# Patient Record
Sex: Male | Born: 1935 | Race: Black or African American | Hispanic: No | Marital: Married | Smoking: Never smoker
Health system: Southern US, Community
[De-identification: ages and names within clinical notes are randomized; demographics above are authoritative.]

## PROBLEM LIST (undated history)

## (undated) DIAGNOSIS — I10 Essential (primary) hypertension: Secondary | ICD-10-CM

## (undated) DIAGNOSIS — C801 Malignant (primary) neoplasm, unspecified: Secondary | ICD-10-CM

## (undated) DIAGNOSIS — M1990 Unspecified osteoarthritis, unspecified site: Secondary | ICD-10-CM

## (undated) DIAGNOSIS — I491 Atrial premature depolarization: Secondary | ICD-10-CM

## (undated) DIAGNOSIS — N289 Disorder of kidney and ureter, unspecified: Secondary | ICD-10-CM

## (undated) DIAGNOSIS — R972 Elevated prostate specific antigen [PSA]: Secondary | ICD-10-CM

## (undated) DIAGNOSIS — D649 Anemia, unspecified: Secondary | ICD-10-CM

## (undated) HISTORY — PX: APPENDECTOMY: SHX54

## (undated) HISTORY — PX: HERNIA REPAIR: SHX51

## (undated) HISTORY — DX: Unspecified osteoarthritis, unspecified site: M19.90

## (undated) HISTORY — PX: JOINT REPLACEMENT: SHX530

---

## 2005-10-29 ENCOUNTER — Ambulatory Visit: Payer: Self-pay | Admitting: Orthopedic Surgery

## 2005-12-04 ENCOUNTER — Inpatient Hospital Stay (HOSPITAL_COMMUNITY): Admission: RE | Admit: 2005-12-04 | Discharge: 2005-12-10 | Payer: Self-pay | Admitting: Orthopedic Surgery

## 2005-12-04 ENCOUNTER — Ambulatory Visit: Payer: Self-pay | Admitting: Orthopedic Surgery

## 2005-12-24 ENCOUNTER — Ambulatory Visit: Payer: Self-pay | Admitting: Orthopedic Surgery

## 2006-01-16 ENCOUNTER — Ambulatory Visit: Payer: Self-pay | Admitting: Orthopedic Surgery

## 2006-03-06 ENCOUNTER — Ambulatory Visit: Payer: Self-pay | Admitting: Orthopedic Surgery

## 2006-06-05 ENCOUNTER — Ambulatory Visit: Payer: Self-pay | Admitting: Orthopedic Surgery

## 2011-05-14 ENCOUNTER — Emergency Department (HOSPITAL_COMMUNITY)
Admission: EM | Admit: 2011-05-14 | Discharge: 2011-05-14 | Disposition: A | Discharge status: Home / Self Care | Payer: Medicare Other | Attending: Emergency Medicine | Admitting: Emergency Medicine

## 2011-05-14 ENCOUNTER — Emergency Department (HOSPITAL_COMMUNITY): Payer: Medicare Other

## 2011-05-14 DIAGNOSIS — M25469 Effusion, unspecified knee: Secondary | ICD-10-CM | POA: Insufficient documentation

## 2011-05-14 DIAGNOSIS — X500XXA Overexertion from strenuous movement or load, initial encounter: Secondary | ICD-10-CM | POA: Insufficient documentation

## 2011-05-14 DIAGNOSIS — I10 Essential (primary) hypertension: Secondary | ICD-10-CM | POA: Insufficient documentation

## 2011-05-14 DIAGNOSIS — R29898 Other symptoms and signs involving the musculoskeletal system: Secondary | ICD-10-CM | POA: Insufficient documentation

## 2011-05-14 DIAGNOSIS — M25569 Pain in unspecified knee: Secondary | ICD-10-CM

## 2011-05-14 DIAGNOSIS — Z79899 Other long term (current) drug therapy: Secondary | ICD-10-CM | POA: Insufficient documentation

## 2011-05-14 HISTORY — DX: Anemia, unspecified: D64.9

## 2011-05-14 HISTORY — DX: Essential (primary) hypertension: I10

## 2011-05-14 MED ORDER — KETOROLAC TROMETHAMINE 60 MG/2ML IM SOLN
30.0000 mg | Freq: Once | INTRAMUSCULAR | Status: AC
  Administered 2011-05-14: 30 mg via INTRAMUSCULAR
  Filled 2011-05-14: qty 2

## 2011-05-14 MED ORDER — HYDROCODONE-ACETAMINOPHEN 5-325 MG PO TABS: ORAL_TABLET | ORAL | Status: AC

## 2011-05-14 NOTE — ED Notes (Signed)
Patient assisted to restroom via wheelchair and returned to room. Denies any other needs at this time.

## 2011-05-14 NOTE — ED Notes (Signed)
Pt reports was getting into his truck Christmas day and twisted his left knee.  Knee swollen.

## 2011-05-14 NOTE — ED Notes (Signed)
Patient with no complaints at this time. Respirations even and unlabored. Skin warm/dry. Discharge instructions reviewed with patient at this time. Patient given opportunity to voice concerns/ask questions. Patient discharged at this time and left Emergency Department with steady gait.   

## 2011-05-14 NOTE — ED Provider Notes (Signed)
History     CSN: [id number]  Arrival date & time 05/14/11  1158   First MD Initiated Contact with Patient 05/14/11 1326      Chief Complaint  Patient presents with  . Knee Pain    (Consider location/radiation/quality/duration/timing/severity/associated sxs/prior treatment) HPI Comments: Patient with hx of arthritis of bilateral knees comes to ed c/o pain to the left knee after twisting his knee greater than one week ago while getting into his truck.  Felt a sharp ain to his knee and has pain with weight bearing since that time.  He denies fall, swelling or the leg or calf, discoloration of the skin or any other sx's at this time.    Patient is a 77 y.o. male presenting with knee pain. The history is provided by the patient and a relative.  Knee Pain This is a new problem. The current episode started in the past 7 days. The problem occurs constantly. The problem has been unchanged. Associated symptoms include arthralgias and joint swelling. Pertinent negatives include no chest pain, myalgias, nausea, neck pain, numbness, vomiting or weakness. The symptoms are aggravated by walking, twisting and standing. He has tried nothing for the symptoms. The treatment provided no relief.    Past Medical History  Diagnosis Date  . Anemia   . Hypertension     Past Surgical History  Procedure Date  . Joint replacement   . Appendectomy   . Hernia repair     No family history on file.  History  Substance Use Topics  . Smoking status: Never Smoker   . Smokeless tobacco: Not on file  . Alcohol Use: No      Review of Systems  HENT: Negative for neck pain.   Respiratory: Negative for chest tightness and shortness of breath.   Cardiovascular: Negative for chest pain.  Gastrointestinal: Negative for nausea and vomiting.  Musculoskeletal: Positive for joint swelling and arthralgias. Negative for myalgias, back pain and gait problem.  Skin: Negative.   Neurological: Negative for  dizziness, weakness and numbness.  Psychiatric/Behavioral: Negative for confusion and decreased concentration.  All other systems reviewed and are negative.    Allergies  Review of patient's allergies indicates no known allergies.  Home Medications   Current Outpatient Rx  Name Route Sig Dispense Refill  . DOCUSATE SODIUM 100 MG PO CAPS Oral Take 100 mg by mouth 2 (two) times daily.      Marland Kitchen FERROUS SULFATE 325 (65 FE) MG PO TABS Oral Take 325 mg by mouth 2 (two) times daily.      Marland Kitchen LISINOPRIL-HYDROCHLOROTHIAZIDE 20-25 MG PO TABS Oral Take 1 tablet by mouth daily.        BP 134/78  Pulse 90  Temp(Src) 98.7 F (37.1 C) (Oral)  Resp 18  Ht 6\' 1"  (1.854 m)  Wt 190 lb (86.183 kg)  BMI 25.07 kg/m2  SpO2 98%  Physical Exam  Nursing note and vitals reviewed. Constitutional: He is oriented to person, place, and time. He appears well-developed and well-nourished. No distress.  HENT:  Head: Normocephalic and atraumatic.  Neck: Normal range of motion. Neck supple. No spinous process tenderness and no muscular tenderness present. Normal range of motion present.  Cardiovascular: Normal rate, regular rhythm and normal heart sounds.   No murmur heard. Pulmonary/Chest: Effort normal and breath sounds normal.  Musculoskeletal: He exhibits tenderness. He exhibits no edema.       Left knee: He exhibits bony tenderness. He exhibits normal range of motion, no swelling, no  effusion, no ecchymosis, no deformity, no laceration, no erythema, normal alignment and normal patellar mobility. tenderness found. Medial joint line and lateral joint line tenderness noted. No patellar tendon tenderness noted.       Legs:      Mild crepitus to the knee joint with movement.  No obvious ligament instability.  No calf pain or swelling , no hip tenderness with movement. Knee joint appears arthritic  Neurological: He is alert and oriented to person, place, and time. He exhibits normal muscle tone. Coordination normal.   Skin: Skin is warm and dry.    ED Course  Procedures (including critical care time)  Labs Reviewed - No data to display Dg Knee Complete 4 Views Left  05/14/2011  *RADIOLOGY REPORT*  Clinical Data: Left knee pain and swelling following a fall on 05/08/2011.  LEFT KNEE - COMPLETE 4+ VIEW  Comparison: None.  Findings: Severe tricompartmental degenerative changes with marked disc space narrowing and associated sclerosis and bony remodeling laterally.  No fracture, dislocation or effusion seen.  Faint rounded area of posterior calcification.  Atheromatous arterial calcifications.  IMPRESSION:  1.  No fracture. 2.  Severe tricompartmental degenerative changes. 3.  Possible popliteal cyst with calcified loose body formation.  Original Report Authenticated By: Darrol Angel, M.D.       MDM    Patient able to bear weight to the left knee.  Pain is reproduced with flexion, STS of the knee w/o obvious effusion or ligament instability.  No neck or back pain on exam.  DP pulse is brisk, sensation intact.  CR <2 sec.  Pt is followed by Dr. Romeo Apple.  Agrees to f/u with him on Wednesday in his office    Pt feels improved after observation and/or treatment in ED.   Patient / Family / Caregiver understand and agree with initial ED impression and plan with expectations set for ED visit.   Crystie Yanko L. [place], [place] 05/16/11 2243

## 2011-05-14 NOTE — ED Provider Notes (Signed)
Medical screening examination/treatment/procedure(s) were performed by non-physician practitioner and as supervising physician I was immediately available for consultation/collaboration.  Blakelyn Dinges P Byrant Valent, MD 05/14/11 1517 

## 2011-05-18 NOTE — ED Provider Notes (Signed)
Medical screening examination/treatment/procedure(s) were performed by non-physician practitioner and as supervising physician I was immediately available for consultation/collaboration.  Velmer Broadfoot P Solomia Harrell, MD 05/18/11 1105 

## 2011-06-05 ENCOUNTER — Encounter: Payer: Self-pay | Admitting: Orthopedic Surgery

## 2011-06-05 ENCOUNTER — Ambulatory Visit (INDEPENDENT_AMBULATORY_CARE_PROVIDER_SITE_OTHER): Payer: Medicare Other | Admitting: Orthopedic Surgery

## 2011-06-05 VITALS — BP 104/64 | Ht 73.0 in | Wt 190.0 lb

## 2011-06-05 DIAGNOSIS — M1710 Unilateral primary osteoarthritis, unspecified knee: Secondary | ICD-10-CM

## 2011-06-05 DIAGNOSIS — IMO0002 Reserved for concepts with insufficient information to code with codable children: Secondary | ICD-10-CM

## 2011-06-05 NOTE — Patient Instructions (Signed)
Consult with Dr Jerelyn Scott to start Procrit for anemia prior to TKA

## 2011-06-06 ENCOUNTER — Encounter: Payer: Self-pay | Admitting: Orthopedic Surgery

## 2011-06-06 DIAGNOSIS — M1710 Unilateral primary osteoarthritis, unspecified knee: Secondary | ICD-10-CM | POA: Insufficient documentation

## 2011-06-06 NOTE — Progress Notes (Addendum)
Patient ID: Devon Griffin, male   DOB: November 11, 1928, 78 y.o.   MRN: [id number]   LEFT knee pain as the chief complaint  This patient had a RIGHT knee replacement in 2008 and did well  He now complains of 6 years of pain in the LEFT knee gradually worsening requiring an emergency room visit on December 31.  His pain is described as sharp intermittent and of moderate intensity, 6/10.  The pain is worse after moving her walking better when sitting worsening cold weather.  He has a history of anemia.  He is interested in getting a LEFT knee replacement  The joint will swell on him and it will catch and lock at times it is stiff.  All the review of systems are normal except for easy bruising and bleeding.  Past Medical History  Diagnosis Date  . Anemia   . Hypertension   . Arthritis    Past Surgical History  Procedure Date  . Joint replacement   . Appendectomy   . Hernia repair    History   Social History  . Marital Status: Married    Spouse Name: N/A    Number of Children: N/A  . Years of Education: N/A   Occupational History  . Not on file.   Social History Main Topics  . Smoking status: Never Smoker   . Smokeless tobacco: Not on file  . Alcohol Use: No  . Drug Use: No  . Sexually Active:    Other Topics Concern  . Not on file   Social History Narrative  . No narrative on file     BP 104/64  Ht 6\' 1"  (1.854 m)  Wt 190 lb (86.183 kg)  BMI 25.07 kg/m2 Somewhat tall lean and in good shape for his age.  Peripheral pulses are normal no swelling or edema.  No evidence of lymphadenopathy.  Skin normal.  Neuro signs are normal.  He's awake alert and oriented x3.  He is ambulatory no assistive devices are needed to his LEFT knee is in valgus and he favors it significantly. On inspecion we have a valgus knee wih join line enderness laerally.  Range of moion is approximaely 118 with a flexion contracture of 5.  Strength is normal.  The knee is stable including the  MCL.  X-rays show severe valgus osteoarthritis of the knee that film is dated December 25 it was done at the hospital.  Note he also had significant LEFT knee effusion today.  Knee  Injection and aspiration Procedure Note  Pre-operative Diagnosis: left knee oa, effusion  Post-operative Diagnosis: same  Indications: pain, swelling  Anesthesia: ethyl chloride   Procedure Details   Verbal consent was obtained for the procedure. Time out was completed.The joint was prepped with alcohol, followed by  Ethyl chloride spray and The 18-gauge needle was inserted into the joint via lateral approach and we aspirated approximately 100 cc of clear yellow fluid  This was followed by the injection of 4ml 1% lidocaine and 1 ml of depomedrol  was then injected into the joint . The needle was removed and the area cleansed and dressed.  Complications:  None; patient tolerated the procedure well.  To prepare for the knee replacement surgery on the LEFT knee we will need to get him on Procrit he has a hemoglobin last recorded as 9.  I would like to see that it well to prevent or decrease the chance of transfusion.  He will also need a consultation with medical  doctor preferably not a nurse practitioner cleared him for surgery based on his age although he appears healthy.  Plan is for LEFT total knee arthroplasty.  The risks and benefits of the surgery including the complications and the treatment have been reviewed with the patient and he understands from his previous surgery what these are.

## 2011-06-15 DEATH — deceased

## 2011-06-19 ENCOUNTER — Encounter (HOSPITAL_COMMUNITY): Payer: Medicare Other | Attending: Oncology | Admitting: Oncology

## 2011-06-19 ENCOUNTER — Encounter (HOSPITAL_COMMUNITY): Payer: Medicare Other

## 2011-06-19 ENCOUNTER — Telehealth: Payer: Self-pay | Admitting: Orthopedic Surgery

## 2011-06-19 ENCOUNTER — Encounter (HOSPITAL_COMMUNITY): Payer: Self-pay | Admitting: Oncology

## 2011-06-19 VITALS — BP 119/82 | HR 82 | Temp 98.0°F | Ht 73.0 in | Wt 190.1 lb

## 2011-06-19 DIAGNOSIS — D649 Anemia, unspecified: Secondary | ICD-10-CM

## 2011-06-19 DIAGNOSIS — M1990 Unspecified osteoarthritis, unspecified site: Secondary | ICD-10-CM | POA: Insufficient documentation

## 2011-06-19 DIAGNOSIS — D638 Anemia in other chronic diseases classified elsewhere: Secondary | ICD-10-CM | POA: Insufficient documentation

## 2011-06-19 DIAGNOSIS — I10 Essential (primary) hypertension: Secondary | ICD-10-CM | POA: Insufficient documentation

## 2011-06-19 HISTORY — DX: Anemia, unspecified: D64.9

## 2011-06-19 LAB — COMPREHENSIVE METABOLIC PANEL
ALT: 9 U/L (ref 0–53)
Albumin: 3.6 g/dL (ref 3.5–5.2)
BUN: 27 mg/dL — ABNORMAL HIGH (ref 6–23)
GFR calc Af Amer: 88 mL/min — ABNORMAL LOW (ref >90)
Potassium: 4.4 mEq/L (ref 3.5–5.1)
Total Bilirubin: 0.2 mg/dL — ABNORMAL LOW (ref 0.3–1.2)
Total Protein: 6.9 g/dL (ref 6.0–8.3)

## 2011-06-19 LAB — CBC
MCH: 30.7 pg (ref 26.0–34.0)
MCHC: 31.8 g/dL (ref 30.0–36.0)
MCV: 96.5 fL (ref 78.0–100.0)
RBC: 3.45 MIL/uL — ABNORMAL LOW (ref 4.22–5.81)
RDW: 14.1 % (ref 11.5–15.5)
WBC: 4.2 10*3/uL (ref 4.0–10.5)

## 2011-06-19 LAB — DIFFERENTIAL
Eosinophils Absolute: 0.1 10*3/uL (ref 0.0–0.7)
Eosinophils Relative: 3 % (ref 0–5)
Lymphocytes Relative: 27 % (ref 12–46)
Lymphs Abs: 1.1 10*3/uL (ref 0.7–4.0)
Monocytes Relative: 9 % (ref 3–12)
Neutro Abs: 2.6 10*3/uL (ref 1.7–7.7)

## 2011-06-19 LAB — SEDIMENTATION RATE: Sed Rate: 9 mm/hr (ref 0–16)

## 2011-06-19 LAB — FOLATE: Folate: 13.3 ng/mL

## 2011-06-19 LAB — RETICULOCYTES: Retic Ct Pct: 0.9 % (ref 0.4–3.1)

## 2011-06-19 MED ORDER — EPOETIN ALFA 40000 UNIT/ML IJ SOLN
INTRAMUSCULAR | Status: AC
  Administered 2011-06-19: 40000 [IU] via SUBCUTANEOUS
  Filled 2011-06-19: qty 1

## 2011-06-19 MED ORDER — EPOETIN ALFA 40000 UNIT/ML IJ SOLN
40000.0000 [IU] | Freq: Once | INTRAMUSCULAR | Status: AC
  Administered 2011-06-19: 40000 [IU] via SUBCUTANEOUS

## 2011-06-19 NOTE — Patient Instructions (Signed)
MATHESON VANDEHEI  [id number] 1928-08-25   [facility]  Discharge Instructions  RECOMMENDATIONS MADE BY THE CONSULTANT AND ANY TEST RESULTS WILL BE SENT TO YOUR REFERRING DOCTOR.   EXAM FINDINGS BY MD TODAY AND SIGNS AND SYMPTOMS TO REPORT TO CLINIC OR PRIMARY MD: Need to draw some blood work today and we will start you on procrit injections 40,000 units weekly.  Want to get your hemoglobin to 12 grams before surger.  MEDICATIONS PRESCRIBED: none  SPECIAL INSTRUCTIONS/FOLLOW-UP: Lab work Needed weekly with injections and Return to Clinic in 1 month to see PA.   I acknowledge that I have been informed and understand all the instructions given to me and received a copy. I do not have any more questions at this time, but understand that I may call the Specialty Clinic at [facility] at [phone] during business hours should I have any further questions or need assistance in obtaining follow-up care.    __________________________________________  _____________  __________ Signature of Patient or Authorized Representative            Date                   Time    __________________________________________ Nurse's Signature

## 2011-06-19 NOTE — Telephone Encounter (Signed)
Dr. Mariel Sleet called to speak with Dr. Romeo Apple regarding mutual patient.  Relayed Dr. Romeo Apple is in surgery this afternoon. States okay to return his call tomorrow, Wednesday, 06/20/11, after 9:00AM.  He also states he will be away at meeting Thursday and Friday.  Phone # is [phone].

## 2011-06-19 NOTE — Progress Notes (Signed)
[facility] NEW PATIENT EVALUATION   Name: Devon Griffin Date: 06/19/2011 MRN: [id number] DOB: 02-06-1929    CC: Lorin Picket [facility]   DIAGNOSIS: The encounter diagnosis was Anemia.   HISTORY OF PRESENT ILLNESS:Devon Griffin is a 78 y.o. African American male who has a past medical history of HTN, and osteoarthritis who was referred to the [facility] for further evaluation and treatment of his anemia pre-operatively for a left knee replacement.  The patient denies any complaints.  He denies any headaches, dizziness, double vision, fevers, chills, night sweats, nausea, vomiting, diarrhea, chest pain, heart palpitations, abdominal pain, constipation, blood in stool, black tarry stool, urinary complaints, and hematuria.  He reports that his appetite and energy level has not changed or decreased.  The patient reports that he has never had a colonoscopy, but recently underwent a prostate digital exam by his PCP.  Review of his lab work reveals on January 01, 2011, the patient's hemoglobin was 9.8, with a normal MCV, WBC 4.4, and platelet count 213, low TIBC at 212, with a low normal serum iron, normal ferritin at 226, B12 397, and folate 16.2.  It appears that the patient has an anemia of chronic disease.  Interestingly, the patient began Ferrous sulfate 325 mg BID daily about 6 months ago.  Since then, his stool color has darkened, but his stools were not dark prior to initiation of iron medication.  He is tolerating the oral iron well without constipation.   FAMILY HISTORY: family history includes Arthritis in an unspecified family member; Cancer in his father; and Diabetes in his mother and unspecified family member.  The patient's father passed away at the age of 35 from rectal cancer.  His mother passed away at the age of 14 from complications of diabetes.  He has 5 deceased brothers and one deceased sister.  None of whom passed away from cancer. He  has no children.  PAST MEDICAL HISTORY:  has a past medical history of Anemia; Hypertension; Arthritis; and Anemia (06/19/2011).       CURRENT MEDICATIONS: See list   SOCIAL HISTORY:  reports that he has never smoked. He has never used smokeless tobacco. He reports that he does not drink alcohol or use illicit drugs.  The patient reports that he was born and presently resides in [place].  He completed 8th grade of schooling.  He worked as a Copy.  He has been married for 50 years to his wife.  He denies any EtOH, tobacco abuse, and illicit drug use.   ALLERGIES: Review of patient's allergies indicates no known allergies.   LABORATORY DATA:  Review of his lab work reveals on January 01, 2011, the patient's hemoglobin was 9.8, with a normal MCV, WBC 4.4, and platelet count 213, low TIBC at 212, with a low normal serum iron, normal ferritin at 226, B12 397, and folate 16.2.  It appears that the patient has an anemia of chronic disease.     REVIEW OF SYSTEMS: Patient reports no health concerns.   PHYSICAL EXAM:  height is 6\' 1"  (1.854 m) and weight is 190 lb 1.6 oz (86.229 kg). His oral temperature is 98 F (36.7 C). His blood pressure is 119/82 and his pulse is 82.  General appearance: alert, cooperative, appears stated age and no distress Head: Normocephalic, without obvious abnormality, atraumatic Ear: Left otoscopic exam reveals a small amount of cerumen in the canal in the anterior/inferior position.  TM is pearly grey without any erythema, discharge, or effusion.  Right side reveals cerumen impaction and I am unable to visualize the TM and the medial canal. Neck: no adenopathy, no carotid bruit, supple, symmetrical, trachea midline and thyroid not enlarged, symmetric, no tenderness/mass/nodules Lymph nodes: Cervical, supraclavicular, and axillary nodes normal. Resp: clear to auscultation bilaterally and normal percussion bilaterally Cardio: regular rate and  rhythm, S1, S2 normal, S3 present, S4 present and systolic ejection murmur heard best at left sternal border at 5/6th intercostal space. GI: soft, non-tender; bowel sounds normal; no masses,  no organomegaly Extremities: edema 1+ LE pitting edema B/L Neurologic: Grossly normal     IMPRESSION:  1. Anemia, with an element of anemia of chronic disease.  He is on PO iron 325 mg BID and tolerating well. 2. HTN 3. Osteoarthritis 4. Right sided ear cerumen.   PLAN:  1. Lab work today: CBC diff, CMET, Retic count, ESR, Epo level, and Anemia Panel. 2. Initiate procrit 40,000 units today 3. Procrit 40,000 units weekly x 12 weeks with the target/goal of 12 g/dL. 4. Recommend screening colonoscopy at some time if PCP thinks this is reasonable at this gentleman's age.  Patient has never had a colonoscopy. 5. Recommend the patient follow-up with PCP regarding his right sided ear cerumen for decongestion/removal. 6. Return for weekly Procrit injections and CBC every other week. 7. Return in one month for follow-up.  All questions were answered.  The patient knows to call the clinic with any questions or concerns.    Patient and plan discussed with Dr. Mariel Sleet and he is in agreement with the aforementioned.  Patient seen by Dr. Mariel Sleet as well.  KEFALAS,THOMAS

## 2011-06-20 LAB — ERYTHROPOIETIN: Erythropoietin: 16.8 m[IU]/mL (ref 2.6–34.0)

## 2011-06-20 LAB — IRON AND TIBC
Iron: 69 ug/dL (ref 42–135)
Saturation Ratios: 29 % (ref 20–55)
TIBC: 235 ug/dL (ref 215–435)
UIBC: 166 ug/dL (ref 125–400)

## 2011-06-26 ENCOUNTER — Encounter (HOSPITAL_BASED_OUTPATIENT_CLINIC_OR_DEPARTMENT_OTHER): Payer: Medicare Other

## 2011-06-26 VITALS — BP 150/68 | HR 73

## 2011-06-26 DIAGNOSIS — D649 Anemia, unspecified: Secondary | ICD-10-CM

## 2011-06-26 MED ORDER — EPOETIN ALFA 40000 UNIT/ML IJ SOLN
INTRAMUSCULAR | Status: AC
  Administered 2011-06-26: 40000 [IU] via SUBCUTANEOUS
  Filled 2011-06-26: qty 1

## 2011-06-26 MED ORDER — EPOETIN ALFA 40000 UNIT/ML IJ SOLN
40000.0000 [IU] | Freq: Once | INTRAMUSCULAR | Status: AC
  Administered 2011-06-26: 40000 [IU] via SUBCUTANEOUS

## 2011-06-26 NOTE — Progress Notes (Signed)
Devon Griffin presents today for injection per MD orders. Procrit 78295 units administered SQ in right Abdomen. Administration without incident. Patient tolerated well.

## 2011-07-03 ENCOUNTER — Encounter (HOSPITAL_BASED_OUTPATIENT_CLINIC_OR_DEPARTMENT_OTHER): Payer: Medicare Other

## 2011-07-03 DIAGNOSIS — D649 Anemia, unspecified: Secondary | ICD-10-CM

## 2011-07-03 LAB — CBC
MCH: 30.8 pg (ref 26.0–34.0)
Platelets: 191 10*3/uL (ref 150–400)

## 2011-07-03 NOTE — Progress Notes (Signed)
Hgb is 13.5 .  Procrit held .  Directed to return in one week.

## 2011-07-10 ENCOUNTER — Encounter (HOSPITAL_BASED_OUTPATIENT_CLINIC_OR_DEPARTMENT_OTHER): Payer: Medicare Other

## 2011-07-10 DIAGNOSIS — D649 Anemia, unspecified: Secondary | ICD-10-CM

## 2011-07-10 LAB — CBC
Hemoglobin: 12.3 g/dL — ABNORMAL LOW (ref 13.0–17.0)
MCHC: 32.1 g/dL (ref 30.0–36.0)
MCV: 97.7 fL (ref 78.0–100.0)
Platelets: 162 10*3/uL (ref 150–400)
RBC: 3.92 MIL/uL — ABNORMAL LOW (ref 4.22–5.81)
RDW: 15.1 % (ref 11.5–15.5)

## 2011-07-10 MED ORDER — EPOETIN ALFA 40000 UNIT/ML IJ SOLN
INTRAMUSCULAR | Status: AC
  Administered 2011-07-10: 40000 [IU] via SUBCUTANEOUS
  Filled 2011-07-10: qty 1

## 2011-07-10 MED ORDER — EPOETIN ALFA 40000 UNIT/ML IJ SOLN
40000.0000 [IU] | Freq: Once | INTRAMUSCULAR | Status: AC
  Administered 2011-07-10: 40000 [IU] via SUBCUTANEOUS

## 2011-07-10 NOTE — Progress Notes (Signed)
Tolerated injection well. 

## 2011-07-17 ENCOUNTER — Encounter (HOSPITAL_COMMUNITY): Payer: Medicare Other | Attending: Oncology

## 2011-07-17 ENCOUNTER — Ambulatory Visit (INDEPENDENT_AMBULATORY_CARE_PROVIDER_SITE_OTHER): Payer: Medicare Other | Admitting: Orthopedic Surgery

## 2011-07-17 ENCOUNTER — Ambulatory Visit (HOSPITAL_COMMUNITY): Payer: Medicare Other | Admitting: Oncology

## 2011-07-17 ENCOUNTER — Encounter (HOSPITAL_BASED_OUTPATIENT_CLINIC_OR_DEPARTMENT_OTHER): Payer: Medicare Other | Admitting: Oncology

## 2011-07-17 ENCOUNTER — Encounter (HOSPITAL_COMMUNITY): Payer: Self-pay | Admitting: Oncology

## 2011-07-17 ENCOUNTER — Encounter: Payer: Self-pay | Admitting: Orthopedic Surgery

## 2011-07-17 VITALS — BP 126/60 | Ht 73.0 in | Wt 190.0 lb

## 2011-07-17 VITALS — BP 146/64 | HR 80 | Temp 97.8°F | Wt 191.1 lb

## 2011-07-17 DIAGNOSIS — M1990 Unspecified osteoarthritis, unspecified site: Secondary | ICD-10-CM

## 2011-07-17 DIAGNOSIS — M1710 Unilateral primary osteoarthritis, unspecified knee: Secondary | ICD-10-CM

## 2011-07-17 DIAGNOSIS — D649 Anemia, unspecified: Secondary | ICD-10-CM | POA: Insufficient documentation

## 2011-07-17 DIAGNOSIS — I10 Essential (primary) hypertension: Secondary | ICD-10-CM

## 2011-07-17 LAB — CBC
HCT: 39.9 % (ref 39.0–52.0)
Hemoglobin: 12.1 g/dL — ABNORMAL LOW (ref 13.0–17.0)
MCH: 30 pg (ref 26.0–34.0)
MCV: 99 fL (ref 78.0–100.0)
RBC: 4.03 MIL/uL — ABNORMAL LOW (ref 4.22–5.81)

## 2011-07-17 MED ORDER — EPOETIN ALFA 40000 UNIT/ML IJ SOLN
40000.0000 [IU] | Freq: Once | INTRAMUSCULAR | Status: AC
  Administered 2011-07-17: 40000 [IU] via SUBCUTANEOUS

## 2011-07-17 MED ORDER — EPOETIN ALFA 40000 UNIT/ML IJ SOLN
INTRAMUSCULAR | Status: AC
  Administered 2011-07-17: 40000 [IU] via SUBCUTANEOUS
  Filled 2011-07-17: qty 1

## 2011-07-17 NOTE — Patient Instructions (Signed)
[facility]  Discharge Instructions  RECOMMENDATIONS MADE BY THE CONSULTANT AND ANY TEST RESULTS WILL BE SENT TO YOUR REFERRING DOCTOR.   EXAM FINDINGS BY MD TODAY AND SIGNS AND SYMPTOMS TO REPORT TO CLINIC OR PRIMARY MD: We will try to keep you at a hgb of 12-13. We will check labs before each shot.   INSTRUCTIONS GIVEN AND DISCUSSED: See surgeon as scheduled. SPECIAL INSTRUCTIONS/FOLLOW-UP: Appt with Tom in 6 weeks.  I acknowledge that I have been informed and understand all the instructions given to me and received a copy. I do not have any more questions at this time, but understand that I may call the Specialty Clinic at [facility] at [phone] during business hours should I have any further questions or need assistance in obtaining follow-up care.    __________________________________________  _____________  __________ Signature of Patient or Authorized Representative            Date                   Time    __________________________________________ Nurse's Signature

## 2011-07-17 NOTE — Patient Instructions (Signed)
Surgery scheduled for 08/20/11

## 2011-07-17 NOTE — Progress Notes (Signed)
Devon Griffin presented for labwork. Labs per MD order drawn via Peripheral Line 23 gauge needle inserted in right forearm   Good blood return present. Procedure without incident.  Needle removed intact. Patient tolerated procedure well.  Devon Griffin presents today for injection per MD orders. Procrit 40,000units administered SQ in right Abdomen. Administration without incident. Patient tolerated well.

## 2011-07-17 NOTE — Progress Notes (Signed)
No primary provider on file. No primary provider on file.  1. Anemia  CBC, epoetin alfa (EPOGEN,PROCRIT) injection 40,000 Units    CURRENT THERAPY: Procrit 40,000 units weekly  INTERVAL HISTORY: Devon Griffin 78 y.o. male returns for  regular  visit for followup of anemia and its treatment in preparation for a left TKA by Dr. Romeo Apple (Orthopod).    The patient denies any complaints  I personally reviewed and went over laboratory results with the patient.  His hemoglobin is responding to CSF, namely Procrit 40,000 units weekly.  His Hgb on 06/19/2011 was 10.6.  His Hgb last week (07/10/11) was 12.3.  Dr. Mariel Sleet contacted Dr. Romeo Apple to let him know that his Hgb is responding and he is good for surgical intervention when deemed fit.  We will continue Procrit to maintain a Hgb of 12-13 g/dL.  We will maintain this level for 4-6 weeks after surgery as well.   ROS: No TIA's or unusual headaches, no dysphagia.  No prolonged cough. No dyspnea or chest pain on exertion.  No abdominal pain, change in bowel habits, black or bloody stools.  No urinary tract symptoms.  No new or unusual musculoskeletal symptoms.    Past Medical History  Diagnosis Date  . Anemia   . Hypertension   . Arthritis   . Anemia 06/19/2011    has OA (osteoarthritis) of knee and Anemia on his problem list.      has no known allergies.  We administered epoetin alfa and epoetin alfa.  Past Surgical History  Procedure Date  . Joint replacement     right knee  . Appendectomy   . Hernia repair     bilateral inguinal repair    Denies any headaches, dizziness, double vision, fevers, chills, night sweats, nausea, vomiting, diarrhea, constipation, chest pain, heart palpitations, shortness of breath, blood in stool, black tarry stool, urinary pain, urinary burning, urinary frequency, hematuria.   PHYSICAL EXAMINATION  ECOG PERFORMANCE STATUS: 2 - Symptomatic, <50% confined to bed  Filed Vitals:   07/17/11 0900  BP:  146/64  Pulse: 80  Temp: 97.8 F (36.6 C)    GENERAL:alert, no distress, well nourished, well developed, comfortable, cooperative, smiling and in a wheelchair. SKIN: skin color, texture, turgor are normal, no rashes or significant lesions HEAD: Normocephalic, No masses, lesions, tenderness or abnormalities EYES: normal EARS: External ears normal OROPHARYNX:mucous membranes are moist  NECK: supple, trachea midline LYMPH:  no palpable lymphadenopathy BREAST:not examined LUNGS: clear to auscultation  HEART: regular rate & rhythm, no gallops, S1 normal, S2 normal and a 1-2/6 systolic ejection murmur appreciated ABDOMEN:abdomen soft, non-tender and normal bowel sounds BACK: Back symmetric, no curvature., No CVA tenderness EXTREMITIES:less then 2 second capillary refill, no skin discoloration, no clubbing, no cyanosis  NEURO: alert & oriented x 3 with fluent speech, no focal motor/sensory deficit   LABORATORY DATA:  Results for Devon, Griffin (MRN [id number]) as of 07/17/2011 10:06  Ref. Range 06/19/2011 12:05 07/03/2011 13:45 07/10/2011 13:55  HGB Latest Range: 13.0-17.0 g/dL 78.2 (L) 95.6 21.3 (L)    PENDING LABS: CBC    ASSESSMENT:  1. Anemia, with an element of anemia of chronic disease. He is on PO iron 325 mg BID and tolerating well.  2. HTN  3. Osteoarthritis  4. Right sided ear cerumen.   PLAN:  1. Lab work today and weekly with Procrit injection: CBC 2. Procrit injection today, 40,000.  Weekly Procrit injection. 3. Hemoglobin goal 12-13 g/dL 4. He has an  appointment today to see Dr. Romeo Apple. 5. I personally reviewed and went over laboratory results with the patient. 6. Will maintain a hemoglobin of 12-13 g/dL 4-6 weeks following surgery. 7. Return in 6 weeks for follow-up.   All questions were answered. The patient knows to call the clinic with any problems, questions or concerns. We can certainly see the patient much sooner if necessary.  The patient and plan  discussed with Glenford Peers, MD and he is in agreement with the aforementioned.   Devon Griffin

## 2011-07-17 NOTE — Progress Notes (Signed)
Patient ID: Devon Griffin, male   DOB: May 16, 1928, 78 y.o.   MRN: [id number] Chief Complaint  Patient presents with  . Follow-up    6 week recheck on left knee.   The patient is seen today for preoperative evaluation for left knee arthroplasties recently been on Procrit to increase his hemoglobin and his hemoglobin has gone up to 13. I spoke to his haematologist we'll continue this up to the time of surgery.  See initial eval note   The history and physical for surgery to be done at a later time and is incorporated by reference  Risks and benefits of the procedure have been explained to the patient and his niece and they aren't comfortable with the potential complications. The patient was satisfied with his right total knee and has come back to have his left knee replaced as well.

## 2011-08-02 ENCOUNTER — Telehealth: Payer: Self-pay | Admitting: Orthopedic Surgery

## 2011-08-02 NOTE — Telephone Encounter (Signed)
RE:  In-patient surgery scheduled 08/20/11 at [facility], CPT [phone], total knee arthroscopy, ICD9 codes 715.96, 715.16 - Per Medicare guidelines, no pre-authorization is required.

## 2011-08-06 ENCOUNTER — Encounter (HOSPITAL_COMMUNITY): Payer: Self-pay | Admitting: Pharmacy Technician

## 2011-08-13 NOTE — Patient Instructions (Addendum)
20 Devon Griffin  08/13/2011   Your procedure is scheduled on:  08/20/2011  Report to [facility] at  615  AM.  Call this number if you have problems the morning of surgery: [phone]   Remember:   Do not eat food:After Midnight.  May have clear liquids:until Midnight .  Clear liquids include soda, tea, black coffee, apple or grape juice, broth.  Take these medicines the morning of surgery with A SIP OF WATER: norco,lisinopril   Do not wear jewelry, make-up or nail polish.  Do not wear lotions, powders, or perfumes. You may wear deodorant.  Do not shave 48 hours prior to surgery.  Do not bring valuables to the hospital.  Contacts, dentures or bridgework may not be worn into surgery.  Leave suitcase in the car. After surgery it may be brought to your room.  For patients admitted to the hospital, checkout time is 11:00 AM the day of discharge.   Patients discharged the day of surgery will not be allowed to drive home.  Name and phone number of your driver: family  Special Instructions: CHG Shower Use Special Wash: 1/2 bottle night before surgery and 1/2 bottle morning of surgery.   Please read over the following fact sheets that you were given: Pain Booklet, Total Joint Packet, MRSA Information, Surgical Site Infection Prevention, Anesthesia Post-op Instructions and Care and Recovery After Surgery Total Knee Replacement In total knee replacement, the damaged knee is replaced with an artificial knee joint (prosthesis). The purpose of this surgery is to reduce pain and improve your range of motion. Regaining a near normal range of motion of your knee in the first 3 to 6 weeks after surgery is critical. Generally, these artificial joints last a minimum of 10 years. By that time, about 1 in 10 patients will need another surgery to repair the loose prosthesis. LET YOUR CAREGIVER KNOW ABOUT:   Allergies to food or medicine.   Medicines taken, including vitamins, herbs, eyedrops, over-the-counter  medicines, and creams.   Use of steroids (by mouth or creams).   Previous problems with anesthetics or numbing medicines.   History of bleeding problems or blood clots.   Previous surgery.   Other health problems, including diabetes and kidney problems.   Possibility of pregnancy, if this applies.  RISKS AND COMPLICATIONS   Knee pain.   Loss of range of motion of the knee (stiffness) or instability.   Loosening of the prosthesis.   Infection.  BEFORE THE PROCEDURE   If you smoke, quit.   You may need a replacement or addition of blood (transfusion) during this procedure. You may want to donate your own blood for storage several weeks before the procedure. This way, your own blood can be stored and given to you if needed. Talk to your surgeon about this option.   Do not eat or drink anything for as long as directed by your caregiver before the procedure.   You may bathe and brush your teeth before the procedure. Do not swallow the water when brushing your teeth.   Scrub the area to be operated on for 5 minutes in the morning before the procedure. Use special soap if you are directed to do so by your caregiver.   Take your regular medicines with a small sip of water unless otherwise instructed. Your caregiver will let you know if there are medicines that need to be stopped and for how long.   You should be present 60 minutes before your  procedure or as directed by your caregiver.  PROCEDURE  Before surgery, an intravenous (IV) access for giving fluids will be started. You will be given medicines and/or gas to make you sleep (anesthetic). You may be given medicines in your back with a needle to make you numb from the waist down. Your surgeon will take out any damaged cartilage and bone. He or she will then put in new metal, plastic, or ceramic joint surfaces to restore alignment and function to your knee. AFTER THE PROCEDURE  You will be taken to the recovery area where a nurse  will watch and check your progress. You may have a long, narrow tube (catheter) in your bladder after surgery. The catheter helps you empty your bladder (pass your urine). You may have drainage tubes coming out from under the dressing. These tubes attach to a device that removes blood or fluids that gather after surgery. Once you are awake, stable, and taking fluids well, you will be returned to your room. You will receive physical therapy as prescribed by your caregiver. If you do not have help at home, you may need to go to an extended care facility for a few weeks. If you are sent home with a continuous passive motion (CPM) machine, use it as instructed. Document Released: 08/06/2000 Document Revised: 04/19/2011 Document Reviewed: 03/02/2009 [facility] Patient Information 2012 [place], [place].PATIENT INSTRUCTIONS POST-ANESTHESIA  IMMEDIATELY FOLLOWING SURGERY:  Do not drive or operate machinery for the first twenty four hours after surgery.  Do not make any important decisions for twenty four hours after surgery or while taking narcotic pain medications or sedatives.  If you develop intractable nausea and vomiting or a severe headache please notify your doctor immediately.  FOLLOW-UP:  Please make an appointment with your surgeon as instructed. You do not need to follow up with anesthesia unless specifically instructed to do so.  WOUND CARE INSTRUCTIONS (if applicable):  Keep a dry clean dressing on the anesthesia/puncture wound site if there is drainage.  Once the wound has quit draining you may leave it open to air.  Generally you should leave the bandage intact for twenty four hours unless there is drainage.  If the epidural site drains for more than 36-48 hours please call the anesthesia department.  QUESTIONS?:  Please feel free to call your physician or the hospital operator if you have any questions, and they will be happy to assist you.     [facility] Anesthesia Department [address] [place] [phone]

## 2011-08-14 ENCOUNTER — Other Ambulatory Visit: Payer: Self-pay

## 2011-08-14 ENCOUNTER — Encounter (HOSPITAL_COMMUNITY): Payer: Self-pay

## 2011-08-14 ENCOUNTER — Encounter (HOSPITAL_COMMUNITY)
Admission: RE | Admit: 2011-08-14 | Discharge: 2011-08-14 | Disposition: A | Discharge status: Home / Self Care | Payer: Medicare Other | Source: Ambulatory Visit | Attending: Orthopedic Surgery | Admitting: Orthopedic Surgery

## 2011-08-14 LAB — BASIC METABOLIC PANEL
CO2: 28 mEq/L (ref 19–32)
Calcium: 9.8 mg/dL (ref 8.4–10.5)
Chloride: 104 mEq/L (ref 96–112)
Potassium: 4.5 mEq/L (ref 3.5–5.1)
Sodium: 140 mEq/L (ref 135–145)

## 2011-08-14 LAB — DIFFERENTIAL
Eosinophils Relative: 4 % (ref 0–5)
Lymphocytes Relative: 29 % (ref 12–46)
Lymphs Abs: 1.1 10*3/uL (ref 0.7–4.0)

## 2011-08-14 LAB — CBC
HCT: 41.7 % (ref 39.0–52.0)
MCV: 96.5 fL (ref 78.0–100.0)
Platelets: 162 10*3/uL (ref 150–400)
RBC: 4.32 MIL/uL (ref 4.22–5.81)
WBC: 3.9 10*3/uL — ABNORMAL LOW (ref 4.0–10.5)

## 2011-08-14 LAB — PREPARE RBC (CROSSMATCH)

## 2011-08-14 LAB — APTT: aPTT: 27 seconds (ref 24–37)

## 2011-08-14 MED ORDER — CHLORHEXIDINE GLUCONATE 4 % EX LIQD
60.0000 mL | Freq: Once | CUTANEOUS | Status: DC
  Filled 2011-08-14: qty 60

## 2011-08-18 NOTE — H&P (Signed)
  LEFT knee pain as the chief complaint  This patient had a RIGHT knee replacement in 2008 and did well  He now complains of 6 years of pain in the LEFT knee gradually worsening requiring an emergency room visit on December 31. His pain is described as sharp intermittent and of moderate intensity, 6/10. The pain is worse after moving her walking better when sitting worsening cold weather. He has a history of anemia. He is interested in getting a LEFT knee replacement  The joint will swell on him and it will catch and lock at times it is stiff. All the review of systems are normal except for easy bruising and bleeding.   Past Medical History   Diagnosis  Date   .  Anemia    .  Hypertension    .  Arthritis     Past Surgical History   Procedure  Date   .  Joint replacement    .  Appendectomy    .  Hernia repair     History    Social History   .  Marital Status:  Married     Spouse Name:  N/A     Number of Children:  N/A   .  Years of Education:  N/A    Occupational History   .  Not on file.    Social History Main Topics   .  Smoking status:  Never Smoker   .  Smokeless tobacco:  Not on file   .  Alcohol Use:  No   .  Drug Use:  No   .  Sexually Active:     Other Topics  Concern   .  Not on file    Social History Narrative   .  No narrative on file     BP 104/64  Ht 6\' 1"  (1.854 m)  Wt 190 lb (86.183 kg)  BMI 25.07 kg/m2  Somewhat tall lean and in good shape for his age. Peripheral pulses are normal no swelling or edema. No evidence of lymphadenopathy. Skin normal. Neuro signs are normal. He's awake alert and oriented x3.  He is ambulatory no assistive devices are needed to his LEFT knee is in valgus and he favors it significantly.   On inspecion we have a valgus knee wih join line enderness laterally. Range of moion is approximaely 118 with a flexion contracture of 5. Strength is normal. The knee is stable including the MCL.   Upper extremity exam  Inspection and  palpation revealed no abnormalities in the upper extremities.  Range of motion is full without contracture.  Motor exam is normal with grade 5 strength.  The joints are fully reduced without subluxation.  There is no atrophy or tremor and muscle tone is normal.  All joints are stable.    X-rays show severe valgus osteoarthritis of the knee that film is dated December 25 it was done at the hospital.   Treated with procrit for long term anemia Hg now 13   Plan is for LEFT total knee arthroplasty. The risks and benefits of the surgery including the complications and the treatment have been reviewed

## 2011-08-20 ENCOUNTER — Inpatient Hospital Stay (HOSPITAL_COMMUNITY)
Admission: RE | Admit: 2011-08-20 | Discharge: 2011-08-23 | DRG: 470 | Disposition: A | Discharge status: Skilled Nursing Facility | Payer: Medicare Other | Source: Ambulatory Visit | Attending: Orthopedic Surgery | Admitting: Orthopedic Surgery

## 2011-08-20 ENCOUNTER — Ambulatory Visit (HOSPITAL_COMMUNITY): Payer: Medicare Other

## 2011-08-20 ENCOUNTER — Encounter (HOSPITAL_COMMUNITY): Payer: Self-pay | Admitting: Anesthesiology

## 2011-08-20 ENCOUNTER — Encounter (HOSPITAL_COMMUNITY)
Admission: RE | Disposition: A | Discharge status: Skilled Nursing Facility | Payer: Self-pay | Source: Ambulatory Visit | Attending: Orthopedic Surgery

## 2011-08-20 ENCOUNTER — Encounter (HOSPITAL_COMMUNITY): Payer: Self-pay | Admitting: *Deleted

## 2011-08-20 ENCOUNTER — Ambulatory Visit (HOSPITAL_COMMUNITY): Payer: Medicare Other | Admitting: Anesthesiology

## 2011-08-20 DIAGNOSIS — L988 Other specified disorders of the skin and subcutaneous tissue: Secondary | ICD-10-CM | POA: Diagnosis not present

## 2011-08-20 DIAGNOSIS — M21069 Valgus deformity, not elsewhere classified, unspecified knee: Secondary | ICD-10-CM | POA: Diagnosis present

## 2011-08-20 DIAGNOSIS — D649 Anemia, unspecified: Secondary | ICD-10-CM

## 2011-08-20 DIAGNOSIS — D638 Anemia in other chronic diseases classified elsewhere: Secondary | ICD-10-CM | POA: Diagnosis present

## 2011-08-20 DIAGNOSIS — Z01812 Encounter for preprocedural laboratory examination: Secondary | ICD-10-CM

## 2011-08-20 DIAGNOSIS — I10 Essential (primary) hypertension: Secondary | ICD-10-CM | POA: Diagnosis present

## 2011-08-20 DIAGNOSIS — R110 Nausea: Secondary | ICD-10-CM | POA: Diagnosis not present

## 2011-08-20 DIAGNOSIS — D62 Acute posthemorrhagic anemia: Secondary | ICD-10-CM | POA: Diagnosis not present

## 2011-08-20 DIAGNOSIS — M24569 Contracture, unspecified knee: Secondary | ICD-10-CM | POA: Diagnosis present

## 2011-08-20 DIAGNOSIS — Z01810 Encounter for preprocedural cardiovascular examination: Secondary | ICD-10-CM

## 2011-08-20 DIAGNOSIS — M1710 Unilateral primary osteoarthritis, unspecified knee: Principal | ICD-10-CM

## 2011-08-20 DIAGNOSIS — Z96659 Presence of unspecified artificial knee joint: Secondary | ICD-10-CM

## 2011-08-20 DIAGNOSIS — IMO0002 Reserved for concepts with insufficient information to code with codable children: Principal | ICD-10-CM | POA: Diagnosis present

## 2011-08-20 DIAGNOSIS — D6959 Other secondary thrombocytopenia: Secondary | ICD-10-CM | POA: Diagnosis present

## 2011-08-20 HISTORY — PX: TOTAL KNEE ARTHROPLASTY: SHX125

## 2011-08-20 SURGERY — ARTHROPLASTY, KNEE, TOTAL
Anesthesia: Spinal | Site: Knee | Laterality: Left | Wound class: Clean

## 2011-08-20 MED ORDER — CEFAZOLIN SODIUM-DEXTROSE 2-3 GM-% IV SOLR
INTRAVENOUS | Status: AC
  Filled 2011-08-20: qty 50

## 2011-08-20 MED ORDER — LACTATED RINGERS IV SOLN
INTRAVENOUS | Status: DC | PRN
  Administered 2011-08-20 (×2): via INTRAVENOUS

## 2011-08-20 MED ORDER — LACTATED RINGERS IV SOLN
INTRAVENOUS | Status: DC
  Administered 2011-08-20: 1000 mL via INTRAVENOUS

## 2011-08-20 MED ORDER — LISINOPRIL-HYDROCHLOROTHIAZIDE 20-25 MG PO TABS: 1.0000 | ORAL_TABLET | Freq: Every day | ORAL | Status: DC

## 2011-08-20 MED ORDER — BUPIVACAINE HCL 0.75 % IJ SOLN
INTRAMUSCULAR | Status: DC | PRN
  Administered 2011-08-20: 2 mL via INTRATHECAL

## 2011-08-20 MED ORDER — TRAMADOL HCL 50 MG PO TABS
50.0000 mg | ORAL_TABLET | Freq: Once | ORAL | Status: AC
  Administered 2011-08-20: 50 mg via ORAL

## 2011-08-20 MED ORDER — METOCLOPRAMIDE HCL 10 MG PO TABS: 5.0000 mg | ORAL_TABLET | Freq: Three times a day (TID) | ORAL | Status: DC | PRN

## 2011-08-20 MED ORDER — MENTHOL 3 MG MT LOZG: 1.0000 | LOZENGE | OROMUCOSAL | Status: DC | PRN

## 2011-08-20 MED ORDER — PROPOFOL 10 MG/ML IV EMUL
INTRAVENOUS | Status: AC
  Filled 2011-08-20: qty 20

## 2011-08-20 MED ORDER — LIDOCAINE HCL (CARDIAC) 10 MG/ML IV SOLN
INTRAVENOUS | Status: DC | PRN
  Administered 2011-08-20: 50 mg via INTRAVENOUS

## 2011-08-20 MED ORDER — MIDAZOLAM HCL 2 MG/2ML IJ SOLN
INTRAMUSCULAR | Status: AC
  Administered 2011-08-20: 2 mg via INTRAVENOUS
  Filled 2011-08-20: qty 2

## 2011-08-20 MED ORDER — MIDAZOLAM HCL 2 MG/2ML IJ SOLN
1.0000 mg | INTRAMUSCULAR | Status: DC | PRN
  Administered 2011-08-20: 2 mg via INTRAVENOUS

## 2011-08-20 MED ORDER — ACETAMINOPHEN 10 MG/ML IV SOLN
1000.0000 mg | Freq: Once | INTRAVENOUS | Status: AC
  Administered 2011-08-20: 1000 mg via INTRAVENOUS

## 2011-08-20 MED ORDER — DEXTROSE 5 % IV SOLN
500.0000 mg | Freq: Four times a day (QID) | INTRAVENOUS | Status: DC
  Administered 2011-08-20: 500 mg via INTRAVENOUS
  Filled 2011-08-20 (×18): qty 5

## 2011-08-20 MED ORDER — DOCUSATE SODIUM 100 MG PO CAPS
100.0000 mg | ORAL_CAPSULE | Freq: Two times a day (BID) | ORAL | Status: DC
  Administered 2011-08-21 – 2011-08-22 (×2): 100 mg via ORAL
  Filled 2011-08-20 (×2): qty 1

## 2011-08-20 MED ORDER — TRAMADOL HCL 50 MG PO TABS
50.0000 mg | ORAL_TABLET | Freq: Four times a day (QID) | ORAL | Status: DC
  Administered 2011-08-20 – 2011-08-23 (×13): 50 mg via ORAL
  Filled 2011-08-20 (×13): qty 1

## 2011-08-20 MED ORDER — FENTANYL CITRATE 0.05 MG/ML IJ SOLN: 25.0000 ug | INTRAMUSCULAR | Status: DC | PRN

## 2011-08-20 MED ORDER — METOCLOPRAMIDE HCL 5 MG/ML IJ SOLN: 5.0000 mg | Freq: Three times a day (TID) | INTRAMUSCULAR | Status: DC | PRN

## 2011-08-20 MED ORDER — 0.9 % SODIUM CHLORIDE (POUR BTL) OPTIME
TOPICAL | Status: DC | PRN
  Administered 2011-08-20: 1000 mL

## 2011-08-20 MED ORDER — METHOCARBAMOL 500 MG PO TABS
500.0000 mg | ORAL_TABLET | Freq: Four times a day (QID) | ORAL | Status: DC
  Administered 2011-08-20 – 2011-08-23 (×13): 500 mg via ORAL
  Filled 2011-08-20 (×13): qty 1

## 2011-08-20 MED ORDER — ACETAMINOPHEN 325 MG PO TABS: 650.0000 mg | ORAL_TABLET | Freq: Four times a day (QID) | ORAL | Status: DC | PRN

## 2011-08-20 MED ORDER — BUPIVACAINE HCL (PF) 0.5 % IJ SOLN
INTRAMUSCULAR | Status: AC
  Filled 2011-08-20: qty 60

## 2011-08-20 MED ORDER — FENTANYL CITRATE 0.05 MG/ML IJ SOLN
INTRAMUSCULAR | Status: DC | PRN
  Administered 2011-08-20: 20 ug via INTRATHECAL

## 2011-08-20 MED ORDER — TRAMADOL HCL 50 MG PO TABS
ORAL_TABLET | ORAL | Status: AC
  Administered 2011-08-20: 50 mg via ORAL
  Filled 2011-08-20: qty 1

## 2011-08-20 MED ORDER — BUPIVACAINE IN DEXTROSE 0.75-8.25 % IT SOLN
INTRATHECAL | Status: AC
  Filled 2011-08-20: qty 2

## 2011-08-20 MED ORDER — BISACODYL 10 MG RE SUPP: 10.0000 mg | Freq: Every day | RECTAL | Status: DC | PRN

## 2011-08-20 MED ORDER — HEMOSTATIC AGENTS (NO CHARGE) OPTIME
TOPICAL | Status: DC | PRN
  Administered 2011-08-20: 1 via TOPICAL

## 2011-08-20 MED ORDER — LISINOPRIL 10 MG PO TABS
20.0000 mg | ORAL_TABLET | Freq: Every day | ORAL | Status: DC
  Administered 2011-08-20 – 2011-08-21 (×2): 20 mg via ORAL
  Filled 2011-08-20 (×3): qty 2

## 2011-08-20 MED ORDER — SENNOSIDES-DOCUSATE SODIUM 8.6-50 MG PO TABS
1.0000 | ORAL_TABLET | Freq: Every evening | ORAL | Status: DC | PRN
  Administered 2011-08-20 (×2): 1 via ORAL
  Filled 2011-08-20 (×2): qty 1

## 2011-08-20 MED ORDER — FENTANYL CITRATE 0.05 MG/ML IJ SOLN
INTRAMUSCULAR | Status: DC | PRN
Start: 2011-08-20 — End: 2011-08-20
  Administered 2011-08-20: 30 ug via INTRAVENOUS
  Administered 2011-08-20 (×2): 25 ug via INTRAVENOUS

## 2011-08-20 MED ORDER — DEXTROSE 5 % IV SOLN
500.0000 mg | Freq: Once | INTRAVENOUS | Status: DC
  Administered 2011-08-20: 500 mg via INTRAVENOUS
  Filled 2011-08-20: qty 5

## 2011-08-20 MED ORDER — BUPIVACAINE ON-Q PAIN PUMP (FOR ORDER SET NO CHG)
INJECTION | Status: AC
  Filled 2011-08-20: qty 1

## 2011-08-20 MED ORDER — CEFAZOLIN SODIUM-DEXTROSE 2-3 GM-% IV SOLR
2.0000 g | Freq: Four times a day (QID) | INTRAVENOUS | Status: AC
  Administered 2011-08-20 (×3): 2 g via INTRAVENOUS
  Filled 2011-08-20 (×3): qty 50

## 2011-08-20 MED ORDER — LIDOCAINE HCL (PF) 1 % IJ SOLN
INTRAMUSCULAR | Status: AC
  Filled 2011-08-20: qty 5

## 2011-08-20 MED ORDER — CEFAZOLIN SODIUM-DEXTROSE 2-3 GM-% IV SOLR: 2.0000 g | INTRAVENOUS | Status: DC

## 2011-08-20 MED ORDER — BUPIVACAINE HCL (PF) 0.25 % IJ SOLN
INTRAMUSCULAR | Status: AC
  Filled 2011-08-20: qty 240

## 2011-08-20 MED ORDER — MAGNESIUM CITRATE PO SOLN: 1.0000 | Freq: Once | ORAL | Status: AC | PRN

## 2011-08-20 MED ORDER — ACETAMINOPHEN 10 MG/ML IV SOLN
1000.0000 mg | Freq: Four times a day (QID) | INTRAVENOUS | Status: AC
  Administered 2011-08-20 – 2011-08-21 (×3): 1000 mg via INTRAVENOUS
  Filled 2011-08-20 (×3): qty 100

## 2011-08-20 MED ORDER — HYDROMORPHONE HCL PF 1 MG/ML IJ SOLN
0.5000 mg | INTRAMUSCULAR | Status: DC | PRN
  Administered 2011-08-20 – 2011-08-21 (×4): 0.5 mg via INTRAVENOUS
  Filled 2011-08-20 (×5): qty 1

## 2011-08-20 MED ORDER — HYDROCHLOROTHIAZIDE 25 MG PO TABS
25.0000 mg | ORAL_TABLET | Freq: Every day | ORAL | Status: DC
  Administered 2011-08-20 – 2011-08-21 (×2): 25 mg via ORAL
  Filled 2011-08-20 (×3): qty 1

## 2011-08-20 MED ORDER — CEFAZOLIN SODIUM 1-5 GM-% IV SOLN
INTRAVENOUS | Status: DC | PRN
  Administered 2011-08-20: 2 g via INTRAVENOUS

## 2011-08-20 MED ORDER — EPHEDRINE SULFATE 50 MG/ML IJ SOLN
INTRAMUSCULAR | Status: DC | PRN
  Administered 2011-08-20: 10 mg via INTRAVENOUS

## 2011-08-20 MED ORDER — ONDANSETRON HCL 4 MG/2ML IJ SOLN: 4.0000 mg | Freq: Once | INTRAMUSCULAR | Status: DC | PRN

## 2011-08-20 MED ORDER — DIPHENHYDRAMINE HCL 12.5 MG/5ML PO ELIX: 12.5000 mg | ORAL_SOLUTION | ORAL | Status: DC | PRN

## 2011-08-20 MED ORDER — ONDANSETRON HCL 4 MG PO TABS: 4.0000 mg | ORAL_TABLET | Freq: Four times a day (QID) | ORAL | Status: DC | PRN

## 2011-08-20 MED ORDER — PROPOFOL 10 MG/ML IV EMUL
INTRAVENOUS | Status: DC | PRN
  Administered 2011-08-20: 50 ug/kg/min via INTRAVENOUS

## 2011-08-20 MED ORDER — PHENOL 1.4 % MT LIQD: 1.0000 | OROMUCOSAL | Status: DC | PRN

## 2011-08-20 MED ORDER — MIDAZOLAM HCL 2 MG/2ML IJ SOLN
INTRAMUSCULAR | Status: AC
  Filled 2011-08-20: qty 2

## 2011-08-20 MED ORDER — SENNA 8.6 MG PO TABS
1.0000 | ORAL_TABLET | Freq: Two times a day (BID) | ORAL | Status: DC
  Administered 2011-08-20 – 2011-08-23 (×6): 8.6 mg via ORAL
  Filled 2011-08-20 (×5): qty 1

## 2011-08-20 MED ORDER — SODIUM CHLORIDE 0.9 % IV SOLN
INTRAVENOUS | Status: AC
  Administered 2011-08-20 – 2011-08-21 (×2): via INTRAVENOUS

## 2011-08-20 MED ORDER — ACETAMINOPHEN 10 MG/ML IV SOLN
INTRAVENOUS | Status: AC
  Administered 2011-08-20: 11:00:00
  Filled 2011-08-20: qty 100

## 2011-08-20 MED ORDER — EPHEDRINE SULFATE 50 MG/ML IJ SOLN
INTRAMUSCULAR | Status: AC
  Filled 2011-08-20: qty 1

## 2011-08-20 MED ORDER — SODIUM CHLORIDE 0.9 % IR SOLN
Status: DC | PRN
  Administered 2011-08-20: 3000 mL

## 2011-08-20 MED ORDER — FENTANYL CITRATE 0.05 MG/ML IJ SOLN
INTRAMUSCULAR | Status: AC
  Filled 2011-08-20: qty 2

## 2011-08-20 MED ORDER — BUPIVACAINE HCL (PF) 0.5 % IJ SOLN
INTRAMUSCULAR | Status: DC | PRN
  Administered 2011-08-20: 60 mL

## 2011-08-20 MED ORDER — FERROUS SULFATE 325 (65 FE) MG PO TABS
325.0000 mg | ORAL_TABLET | Freq: Two times a day (BID) | ORAL | Status: DC
  Administered 2011-08-20 – 2011-08-23 (×6): 325 mg via ORAL
  Filled 2011-08-20 (×7): qty 1

## 2011-08-20 MED ORDER — ONDANSETRON HCL 4 MG/2ML IJ SOLN: 4.0000 mg | Freq: Four times a day (QID) | INTRAMUSCULAR | Status: DC | PRN

## 2011-08-20 MED ORDER — ACETAMINOPHEN 650 MG RE SUPP: 650.0000 mg | Freq: Four times a day (QID) | RECTAL | Status: DC | PRN

## 2011-08-20 MED ORDER — DOCUSATE SODIUM 100 MG PO CAPS
100.0000 mg | ORAL_CAPSULE | Freq: Two times a day (BID) | ORAL | Status: DC
  Administered 2011-08-20 – 2011-08-23 (×6): 100 mg via ORAL
  Filled 2011-08-20 (×6): qty 1

## 2011-08-20 MED ORDER — ALUM & MAG HYDROXIDE-SIMETH 200-200-20 MG/5ML PO SUSP: 30.0000 mL | ORAL | Status: DC | PRN

## 2011-08-20 MED ORDER — BUPIVACAINE HCL (PF) 0.25 % IJ SOLN
INTRAMUSCULAR | Status: AC
  Filled 2011-08-20: qty 30

## 2011-08-20 MED ORDER — BUPIVACAINE 0.25 % ON-Q PUMP SINGLE CATH 300ML
INJECTION | Status: DC | PRN
  Administered 2011-08-20: 300 mL

## 2011-08-20 SURGICAL SUPPLY — 70 items
BAG HAMPER (MISCELLANEOUS) ×2 IMPLANT
BANDAGE ELASTIC 4 VELCRO NS (GAUZE/BANDAGES/DRESSINGS) ×2 IMPLANT
BANDAGE ELASTIC 6 VELCRO NS (GAUZE/BANDAGES/DRESSINGS) ×4 IMPLANT
BANDAGE ESMARK 6X9 LF (GAUZE/BANDAGES/DRESSINGS) ×1 IMPLANT
BIT DRILL 3.2X128 (BIT) ×1 IMPLANT
BLADE HEX COATED 2.75 (ELECTRODE) ×2 IMPLANT
BLADE SAG 18X100X1.27 (BLADE) ×2 IMPLANT
BLADE SAGITTAL 25.0X1.27X90 (BLADE) ×2 IMPLANT
BLADE SAW SAG 90X13X1.27 (BLADE) ×2 IMPLANT
BNDG CMPR 9X6 STRL LF SNTH (GAUZE/BANDAGES/DRESSINGS) ×1
BNDG ESMARK 6X9 LF (GAUZE/BANDAGES/DRESSINGS) ×2
BOWL SMART MIX CTS (DISPOSABLE) IMPLANT
CATH KIT ON Q 2.5IN SLV (PAIN MANAGEMENT) ×2 IMPLANT
CEMENT HV SMART SET (Cement) ×4 IMPLANT
CLOTH BEACON ORANGE TIMEOUT ST (SAFETY) ×2 IMPLANT
COOLER CRYO CUFF IC AND MOTOR (MISCELLANEOUS) ×2 IMPLANT
COVER PROBE W GEL 5X96 (DRAPES) ×2 IMPLANT
COVER SURGICAL LIGHT HANDLE (MISCELLANEOUS) ×4 IMPLANT
CUFF CRYO KNEE LG 20X31 COOLER (ORTHOPEDIC SUPPLIES) IMPLANT
CUFF CRYO KNEE18X23 MED (MISCELLANEOUS) ×1 IMPLANT
CUFF TOURNIQUET SINGLE 34IN LL (TOURNIQUET CUFF) ×1 IMPLANT
CUFF TOURNIQUET SINGLE 44IN (TOURNIQUET CUFF) IMPLANT
DECANTER SPIKE VIAL GLASS SM (MISCELLANEOUS) ×22 IMPLANT
DRAPE BACK TABLE (DRAPES) ×2 IMPLANT
DRAPE EXTREMITY T 121X128X90 (DRAPE) ×2 IMPLANT
DRAPE U-SHAPE 47X51 STRL (DRAPES) ×1 IMPLANT
DRSG MEPILEX BORDER 4X12 (GAUZE/BANDAGES/DRESSINGS) ×2 IMPLANT
DURAPREP 26ML APPLICATOR (WOUND CARE) ×2 IMPLANT
ELECT REM PT RETURN 9FT ADLT (ELECTROSURGICAL) ×2
ELECTRODE REM PT RTRN 9FT ADLT (ELECTROSURGICAL) ×1 IMPLANT
FACESHIELD LNG OPTICON STERILE (SAFETY) ×2 IMPLANT
GLOVE BIOGEL PI IND STRL 7.0 (GLOVE) IMPLANT
GLOVE BIOGEL PI INDICATOR 7.0 (GLOVE) ×5
GLOVE ECLIPSE 6.5 STRL STRAW (GLOVE) ×3 IMPLANT
GLOVE EXAM NITRILE MD LF STRL (GLOVE) ×2 IMPLANT
GLOVE OPTIFIT SS 8.0 STRL (GLOVE) ×2 IMPLANT
GLOVE SKINSENSE NS SZ8.0 LF (GLOVE) ×3
GLOVE SKINSENSE STRL SZ8.0 LF (GLOVE) ×2 IMPLANT
GLOVE SS N UNI LF 8.5 STRL (GLOVE) ×2 IMPLANT
GOWN STRL REIN XL XLG (GOWN DISPOSABLE) ×8 IMPLANT
HANDPIECE INTERPULSE COAX TIP (DISPOSABLE) ×2
HOOD W/PEELAWAY (MISCELLANEOUS) ×8 IMPLANT
INST SET MAJOR BONE (KITS) ×2 IMPLANT
IV NS IRRIG 3000ML ARTHROMATIC (IV SOLUTION) ×2 IMPLANT
KIT BLADEGUARD II DBL (SET/KITS/TRAYS/PACK) ×2 IMPLANT
KIT ROOM TURNOVER APOR (KITS) ×2 IMPLANT
MANIFOLD NEPTUNE II (INSTRUMENTS) ×2 IMPLANT
MARKER SKIN DUAL TIP RULER LAB (MISCELLANEOUS) ×2 IMPLANT
NDL HYPO 21X1.5 SAFETY (NEEDLE) ×1 IMPLANT
NEEDLE HYPO 21X1.5 SAFETY (NEEDLE) ×2 IMPLANT
NS IRRIG 1000ML POUR BTL (IV SOLUTION) ×2 IMPLANT
PACK TOTAL JOINT (CUSTOM PROCEDURE TRAY) ×2 IMPLANT
PAD ARMBOARD 7.5X6 YLW CONV (MISCELLANEOUS) ×3 IMPLANT
PAD DANNIFLEX CPM (ORTHOPEDIC SUPPLIES) ×2 IMPLANT
PIN TROCAR 3 INCH (PIN) ×2 IMPLANT
PUMP ON Q 270MLX5ML/HR (PAIN MANAGEMENT) ×2 IMPLANT
SET BASIN LINEN APH (SET/KITS/TRAYS/PACK) ×2 IMPLANT
SET HNDPC FAN SPRY TIP SCT (DISPOSABLE) ×1 IMPLANT
SPONGE GAUZE 4X4 12PLY (GAUZE/BANDAGES/DRESSINGS) ×1 IMPLANT
STAPLER VISISTAT 35W (STAPLE) ×2 IMPLANT
SUT BRALON NAB BRD #1 30IN (SUTURE) ×3 IMPLANT
SUT MON AB 0 CT1 (SUTURE) ×4 IMPLANT
SUT MON AB 2-0 CT1 36 (SUTURE) ×1 IMPLANT
SYR 30ML LL (SYRINGE) ×2 IMPLANT
SYR BULB IRRIGATION 50ML (SYRINGE) ×2 IMPLANT
TOWEL OR 17X26 4PK STRL BLUE (TOWEL DISPOSABLE) ×2 IMPLANT
TOWER CARTRIDGE SMART MIX (DISPOSABLE) ×3 IMPLANT
TRAY FOLEY CATH 14FR (SET/KITS/TRAYS/PACK) ×2 IMPLANT
WATER STERILE IRR 1000ML POUR (IV SOLUTION) ×6 IMPLANT
YANKAUER SUCT 12FT TUBE ARGYLE (SUCTIONS) ×2 IMPLANT

## 2011-08-20 NOTE — Brief Op Note (Signed)
08/20/2011  10:01 AM  PATIENT:  Devon Griffin  78 y.o. male  PRE-OPERATIVE DIAGNOSIS:  Left knee osteoarthritis  POST-OPERATIVE DIAGNOSIS:  Left knee osteoarthritis  PROCEDURE:  Procedure(s) (LRB): TOTAL KNEE ARTHROPLASTY (Left) depuy 5 f 5t 38 p 10 ps poly   SURGEON:  Surgeon(s) and Role:    * Vickki Hearing, MD - Primary  PHYSICIAN ASSISTANT:   ASSISTANTS: betty ashley and wayne mcfatter    ANESTHESIA:   spinal  EBL:  Total I/O In: 1600 [I.V.:1600] Out: 750 [Urine:600; Blood:150]  BLOOD ADMINISTERED:none  DRAINS: none   LOCAL MEDICATIONS USED:  MARCAINE   With epi 60 cc  SPECIMEN:  No Specimen  DISPOSITION OF SPECIMEN:  N/A  COUNTS:  YES  TOURNIQUET:   Total Tourniquet Time Documented: Thigh (Left) - 88 minutes  DICTATION: .Reubin Milan Dictation  PLAN OF CARE: Admit to inpatient   PATIENT DISPOSITION:  PACU - hemodynamically stable.   Delay start of Pharmacological VTE agent (>24hrs) due to surgical blood loss or risk of bleeding: yes

## 2011-08-20 NOTE — Preoperative (Signed)
Beta Blockers   Reason not to administer Beta Blockers:Not Applicable 

## 2011-08-20 NOTE — Anesthesia Postprocedure Evaluation (Signed)
  Anesthesia Post-op Note  Patient: Devon Griffin  Procedure(s) Performed: Procedure(s) (LRB): TOTAL KNEE ARTHROPLASTY (Left)  Patient Location: PACU  Anesthesia Type: Spinal  Level of Consciousness: awake, alert , oriented and patient cooperative  Airway and Oxygen Therapy: Patient Spontanous Breathing and Patient connected to nasal cannula oxygen  Post-op Pain: none  Post-op Assessment: Post-op Vital signs reviewed, Patient's Cardiovascular Status Stable, Respiratory Function Stable, Patent Airway, Pain level controlled, No headache and No backache  Post-op Vital Signs: Reviewed and stable  Complications: No apparent anesthesia complications

## 2011-08-20 NOTE — Interval H&P Note (Signed)
History and Physical Interval Note:  08/20/2011 7:27 AM  Devon Griffin  has presented today for surgery, with the diagnosis of Left knee osteoarthritis  The various methods of treatment have been discussed with the patient and family. After consideration of risks, benefits and other options for treatment, the patient has consented to  Procedure(s) (LRB):LEFT TOTAL KNEE ARTHROPLASTY (Left) as a surgical intervention .  The patients' history has been reviewed, patient examined, no change in status, stable for surgery.  I have reviewed the patients' chart and labs.  Questions were answered to the patient's satisfaction.     Fuente Canada

## 2011-08-20 NOTE — Transfer of Care (Signed)
Immediate Anesthesia Transfer of Care Note  Patient: Devon Griffin  Procedure(s) Performed: Procedure(s) (LRB): TOTAL KNEE ARTHROPLASTY (Left)  Patient Location: PACU  Anesthesia Type: Spinal  Level of Consciousness: awake, alert , oriented and patient cooperative  Airway & Oxygen Therapy: Patient Spontanous Breathing and Patient connected to nasal cannula oxygen  Post-op Assessment: Report given to PACU RN and Post -op Vital signs reviewed and stable  Post vital signs: Reviewed and stable  Complications: No apparent anesthesia complications

## 2011-08-20 NOTE — Anesthesia Procedure Notes (Signed)
Spinal  Patient location during procedure: OR Start time: 08/20/2011 7:58 AM Staffing CRNA/Resident: ANDRAZA, Coty Student L Preanesthetic Checklist Completed: patient identified, site marked, surgical consent, pre-op evaluation, timeout performed, IV checked, risks and benefits discussed and monitors and equipment checked Spinal Block Patient position: left lateral decubitus Prep: Betadine Patient monitoring: heart rate, cardiac monitor, continuous pulse ox and blood pressure Approach: left paramedian Location: L3-4 Injection technique: single-shot Needle Needle type: Spinocan  Needle gauge: 22 G Needle length: 9 cm Assessment Sensory level: T8 Additional Notes  ATTEMPTS:2 TRAY [id number]: TRAY EXPIRATION DATE:03/2012  Attempt x 1 Andraza,CRNA; Placed by Dr. Marcos Eke x 1 attempt Bupivacaine .75%68ml Fentanyl 20 mcg Epi.1 injected intrathecally at 0758

## 2011-08-20 NOTE — Anesthesia Preprocedure Evaluation (Signed)
Anesthesia Evaluation  Patient identified by MRN, date of birth, ID band Patient awake    Reviewed: Allergy & Precautions, H&P , NPO status , Patient's Chart, lab work & pertinent test results  History of Anesthesia Complications Negative for: history of anesthetic complications  Airway Mallampati: II      Dental  (+) Edentulous Upper and Edentulous Lower   Pulmonary neg pulmonary ROS,  breath sounds clear to auscultation        Cardiovascular hypertension, Pt. on medications Rhythm:Regular     Neuro/Psych    GI/Hepatic   Endo/Other    Renal/GU      Musculoskeletal   Abdominal   Peds  Hematology   Anesthesia Other Findings   Reproductive/Obstetrics                           Anesthesia Physical Anesthesia Plan  ASA: II  Anesthesia Plan: Spinal   Post-op Pain Management:    Induction:   Airway Management Planned: Nasal Cannula  Additional Equipment:   Intra-op Plan:   Post-operative Plan:   Informed Consent: I have reviewed the patients History and Physical, chart, labs and discussed the procedure including the risks, benefits and alternatives for the proposed anesthesia with the patient or authorized representative who has indicated his/her understanding and acceptance.     Plan Discussed with:   Anesthesia Plan Comments:         Anesthesia Quick Evaluation

## 2011-08-20 NOTE — Progress Notes (Signed)
Clinical Social Work Department BRIEF PSYCHOSOCIAL ASSESSMENT 08/20/2011  Patient:  Devon Griffin, Devon Griffin     Account Number:  [id number]     Admit date:  08/20/2011  Clinical Social Worker:  Robin Searing  Date/Time:  08/20/2011 01:46 PM  Referred by:  Physician  Date Referred:  08/20/2011 Referred for  SNF Placement   Other Referral:   Interview type:  Other - See comment Other interview type:   Spoke with [place], family rep- [phone].    PSYCHOSOCIAL DATA Living Status:  WIFE Admitted from facility:   Level of care:   Primary support name:  family-wife and neice's Primary support relationship to patient:  FAMILY Degree of support available:   Minimal as wife needs assistance- [place] working with another family member to get someone to be with patient's wife as he will be going to SNF at d.c    CURRENT CONCERNS Current Concerns  Post-Acute Placement   Other Concerns:   Family has concerns about the wife and I have encouraged them to seek Respite ALF placment for her or consider hiring help if family cannot be with her at  this time.    SOCIAL WORK ASSESSMENT / PLAN SNF at d/c   Assessment/plan status:  Other - See comment Other assessment/ plan:   FL2 and Pasarr to be completed for SNF search.   Information/referral to community resources:    PATIENT'S/FAMILY'S RESPONSE TO PLAN OF CARE: Family appreciative and agreeable to plans-    Reece Levy, MSW, [employer] [phone]

## 2011-08-20 NOTE — Op Note (Signed)
Preop diagnosis osteoarthritis left knee Postop diagnosis same Procedure left total knee arthroplasty Surgeon Romeo Apple Assisted by betty Morrie Sheldon and wayne mcfatter Anesthesia spinal Findings severe oa with moderate valgus deformity and flex contracture  Tourniquet time , pressure 300 mm of mercury  Indications for procedure disabling knee pain, failure to control pain with nonoperative measures  Details of procedure:  In the preop area the patient's left knee was marked and countersigned by the surgeon, the chart was updated, consent was signed  The patient was taken to the operating room for spinal anesthetic followed by administering 2 g of Ancef based on weight of >80kg kg  A Foley catheter was inserted sterilely, then the operative extremity left leg including the foot  was prepped and draped sterilely  The timeout was completed  The limb was then exsanguinated with a six-inch Esmarch with the knee in flexion and the tourniquet was elevated to 300 mm of mercury. A midline incision was made, the subcutaneous tissues were divided down to the extensor mechanism. A medial arthrotomy was performed, the patella was everted,  the fat pad was resected. The medial and  lateral menisci were resected. The medial soft tissue sleeve was elevated to the mid coronal plane. The anterior cruciate ligament and PCL were resected. Osteophytes were removed. The distal femur anterior surface was skeletonized with sharp dissection.  A three-eighths inch drill bit was used to enter the femoral canal which was suctioned and irrigated until the fluid was clear;  an intramedullary rod was placed in the femur with a 5 / 12 mm  setting, the block was pinned in place; then an 12 mm distal femoral resection was performed. The cut was checked for flatness.  The sizing guide was then placed on the femur, the femur measured a size 5; the block was pinned in external rotation using the epicondyles and Whitesides  line as reference due to posterior condylar deficiency; a  4-in-1 cutting block was placed and the 4 cuts were made with retractors protecting the collateral ligaments. The posterior osteophytes were removed with a curved osteotome. Residual PCL tissue was resected; residual meniscal tissue was resected.  The external tibial alignment instrument was set for tibial resection. The guide was   placed referencing the medial side which was the worn side and the stylus was set at 2 mm resection. Anterior slope was built in to match the patients anatomy; a neutral varus valgus cut was set using the medial third of the tibial tubercle as reference along with the medial portion of the lateral tibial spine. Theguide was stabilized with pins.  A saw was used to resect the anterior tibia. The tibia was sized to a size 5 base plate.  We then placed spacer blocks using a 10 and a 12.5 spacer block;  the knee was balanced  in extension and was balanced in flexion with the 10 spacer block. Laminar spreader were also placed to confirm equal ligament tension  The box cut was then done using the box cutting guide. We then turned our attention to the patella.  The patella measured 27 mm we set the guide to leave 16 mm of patella.  After resection of the patella,  It was remeasured, and measured 18 mm. The size was 38/9. We then drilled the 3 peg holes.  We then did a trial reduction. The trial reduction was excellent with full extension, balanced in extension, balance in flexion. Passive flexion equalled 115, patella normal tracking.   We then  punched the tibia per technique.   The bone was then irrigated and dried while cement was mixed on the back table. The implants were checked for accuracy and then cemented in place; excess cement was removed; the cement was allowed to cure. The wound was then irrigated with copious amounts of saline, the posterior capsule was injected with 30 cc of Marcaine with epinephrine followed by  30 cc in the soft tissue. The 10mm insert was placed. Range of motion matched trial reduction  The capsule was closed with #1 Bralon in interrupted and running fashion and then the joint was injected with 30 cc of Marcaine with epinephrine.  The subcutaneous pain pump was placed.  The subcutaneous tissues were closed with  0 Monocryl and 2-0 Monocryl in running fashion  Staples were used to reapproximate the skin edges.  Sterile dressings were applied. Cryo/Cuff was placed and activated.  The patient was then taken to the recovery room in stable condition.  Routine postop plan for knee replacement.

## 2011-08-21 ENCOUNTER — Encounter (HOSPITAL_COMMUNITY): Payer: Self-pay | Admitting: *Deleted

## 2011-08-21 ENCOUNTER — Other Ambulatory Visit (HOSPITAL_COMMUNITY): Payer: Self-pay | Admitting: Oncology

## 2011-08-21 DIAGNOSIS — D638 Anemia in other chronic diseases classified elsewhere: Secondary | ICD-10-CM

## 2011-08-21 DIAGNOSIS — I10 Essential (primary) hypertension: Secondary | ICD-10-CM

## 2011-08-21 LAB — CBC
Hemoglobin: 8.8 g/dL — ABNORMAL LOW (ref 13.0–17.0)
MCH: 30.3 pg (ref 26.0–34.0)
MCHC: 31.3 g/dL (ref 30.0–36.0)
Platelets: 106 10*3/uL — ABNORMAL LOW (ref 150–400)

## 2011-08-21 LAB — IRON AND TIBC
Iron: 15 ug/dL — ABNORMAL LOW (ref 42–135)
Saturation Ratios: 10 % — ABNORMAL LOW (ref 20–55)
TIBC: 151 ug/dL — ABNORMAL LOW (ref 215–435)
UIBC: 136 ug/dL (ref 125–400)

## 2011-08-21 LAB — BASIC METABOLIC PANEL
Calcium: 8.7 mg/dL (ref 8.4–10.5)
Creatinine, Ser: 1.47 mg/dL — ABNORMAL HIGH (ref 0.50–1.35)
GFR calc non Af Amer: 43 mL/min — ABNORMAL LOW (ref >90)
Glucose, Bld: 141 mg/dL — ABNORMAL HIGH (ref 70–99)
Sodium: 137 mEq/L (ref 135–145)

## 2011-08-21 MED ORDER — EPOETIN ALFA 40000 UNIT/ML IJ SOLN
40000.0000 [IU] | INTRAMUSCULAR | Status: DC
  Administered 2011-08-21: 40000 [IU] via SUBCUTANEOUS
  Filled 2011-08-21: qty 1

## 2011-08-21 MED ORDER — MAGNESIUM CITRATE PO SOLN
1.0000 | Freq: Once | ORAL | Status: AC
  Administered 2011-08-21: 1 via ORAL
  Filled 2011-08-21: qty 296

## 2011-08-21 MED ORDER — ACETAMINOPHEN 10 MG/ML IV SOLN
INTRAVENOUS | Status: AC
  Filled 2011-08-21: qty 100

## 2011-08-21 MED ORDER — ASPIRIN EC 325 MG PO TBEC
325.0000 mg | DELAYED_RELEASE_TABLET | Freq: Two times a day (BID) | ORAL | Status: DC
  Administered 2011-08-21 – 2011-08-23 (×5): 325 mg via ORAL
  Filled 2011-08-21 (×5): qty 1

## 2011-08-21 NOTE — Progress Notes (Signed)
     Clinical Social Work Department CLINICAL SOCIAL WORK PLACEMENT NOTE 08/21/2011  Patient:  Devon Griffin, Devon Griffin  Account Number:  [id number] Admit date:  08/20/2011  Clinical Social Worker:  Robin Searing  Date/time:  08/21/2011 12:02 PM  Clinical Social Work is seeking post-discharge placement for this patient at the following level of care:   SKILLED NURSING   (*CSW will update this form in Epic as items are completed)   08/21/2011  Patient/family provided with [facility] Department of Clinical Social Work's list of facilities offering this level of care within the geographic area requested by the patient (or if unable, by the patient's family).  08/21/2011  Patient/family informed of their freedom to choose among providers that offer the needed level of care, that participate in Medicare, Medicaid or managed care program needed by the patient, have an available bed and are willing to accept the patient.  08/21/2011  Patient/family informed of [facility]' ownership interest in [facility], as well as of the fact that they are under no obligation to receive care at this facility.  PASARR submitted to EDS on 08/21/2011 PASARR number received from EDS on 08/21/2011  FL2 transmitted to all facilities in geographic area requested by pt/family on  08/21/2011 FL2 transmitted to all facilities within larger geographic area on   Patient informed that his/her managed care company has contracts with or will negotiate with  certain facilities, including the following:     Patient/family informed of bed offers received:   Patient chooses bed at  Physician recommends and patient chooses bed at    Patient to be transferred to  on   Patient to be transferred to facility by   The following physician request were entered in Epic:   Additional Comments:  Reece Levy, MSW, [employer] [phone]

## 2011-08-21 NOTE — Progress Notes (Signed)
UR Chart Review Completed  

## 2011-08-21 NOTE — Consult Note (Signed)
[facility] Consultation Oncology  Name: Devon Griffin      MRN: [id number]    Location: A306/A306-01  Date: 08/21/2011 Time:1:39 PM   REFERRING PHYSICIAN:  Loredo Canada, MD  REASON FOR CONSULT:  Anemia   DIAGNOSIS: Anemia of Chronic Disease  HISTORY OF PRESENT ILLNESS:   This is an 78 year old African American who is well known to the [facility] who recently underwent a left complete knee arthroplasty on 08/20/2011.  He tolerated the surgery well.  He was being seen at the [facility] for treatment of his anemia of chronic disease prior to surgery.  He received 40,000 units weekly and responded well to therapy.  We were able to raise his hemoglobin to 13.1 just prior to surgery.  He responded well to the procrit injections.  He did not have any complications from the injections in the past.  We are thus consulted for his anemia following surgery.  The patient is seen in the bed in his hospital room. He appears very pleasant as usual. He denies any pain or discomfort. He does admit to some constipation for which he recently consumed magnesium citrate. He denies any chest pain or shortness of breath. He feels well. He is glad that he went through with the surgery. He is anticipating a quick recovery. He explains to me that the plan is for him to remain in the hospital for a few more days and then subsequently be transferred to [facility] for rehabilitation for a short period of time.  I spent some time and patient going over education regarding his anemia of chronic disease. We will reinitiate his Procrit 40,000 units therapy every week in order to maintain a hemoglobin between 12 and 13 to help with his healing process. We'll do this for approximately 4-6 weeks postoperatively.  PAST MEDICAL HISTORY:   Past Medical History  Diagnosis Date  . Anemia   . Hypertension   . Arthritis   . Anemia 06/19/2011    ALLERGIES: No Known Allergies    MEDICATIONS: I  have reviewed the patient's current medications.     PAST SURGICAL HISTORY Past Surgical History  Procedure Date  . Joint replacement     right knee  . Appendectomy   . Hernia repair     bilateral inguinal repair    FAMILY HISTORY: Family History  Problem Relation Age of Onset  . Arthritis    . Diabetes Mother   . Cancer Father     SOCIAL HISTORY:  reports that he has never smoked. He has never used smokeless tobacco. He reports that he does not drink alcohol or use illicit drugs.  PERFORMANCE STATUS: The patient's performance status is 4 - Bedbound due to his recent surgery  PHYSICAL EXAM: Most Recent Vital Signs: Blood pressure 94/59, pulse 66, temperature 98.2 F (36.8 C), temperature source Oral, resp. rate 20, height 6\' 1"  (1.854 m), weight 198 lb (89.812 kg), SpO2 94.00%. General appearance: alert, cooperative and no distress Head: Normocephalic, without obvious abnormality, atraumatic Eyes: conjunctivae/corneas clear, EOM's intact.  Neck: supple, symmetrical, trachea midline Lungs: clear to auscultation bilaterally anteriorly Heart: regular rate and rhythm, S1, S2 normal, no murmur, click, rub or gallop Abdomen: soft, non-tender; bowel sounds normal; no masses,  no organomegaly Extremities: Left lower extremity immobilized, right lower extremity with pneumatic compression stockings without any abnormalities appreciated. Skin: Skin color, texture, turgor normal. No rashes or lesions Neurologic: Grossly normal. Foley Catheter: Producing clear  yellow urine.  LABORATORY DATA:  Results for orders placed during the hospital encounter of 08/20/11 (from the past 48 hour(s))  CBC     Status: Abnormal   Collection Time   08/21/11  4:45 AM      Component Value Range Comment   WBC 9.1  4.0 - 10.5 (K/uL)    RBC 2.90 (*) 4.22 - 5.81 (MIL/uL)    Hemoglobin 8.8 (*) 13.0 - 17.0 (g/dL)    HCT 40.9 (*) 81.1 - 52.0 (%)    MCV 96.9  78.0 - 100.0 (fL)    MCH 30.3  26.0 - 34.0 (pg)     MCHC 31.3  30.0 - 36.0 (g/dL)    RDW 91.4  78.2 - 95.6 (%)    Platelets 106 (*) 150 - 400 (K/uL)   BASIC METABOLIC PANEL     Status: Abnormal   Collection Time   08/21/11  4:45 AM      Component Value Range Comment   Sodium 137  135 - 145 (mEq/L)    Potassium 4.2  3.5 - 5.1 (mEq/L)    Chloride 103  96 - 112 (mEq/L)    CO2 26  19 - 32 (mEq/L)    Glucose, Bld 141 (*) 70 - 99 (mg/dL)    BUN 33 (*) 6 - 23 (mg/dL)    Creatinine, Ser 2.13 (*) 0.50 - 1.35 (mg/dL)    Calcium 8.7  8.4 - 10.5 (mg/dL)    GFR calc non Af Amer 43 (*) >90 (mL/min)    GFR calc Af Amer 50 (*) >90 (mL/min)       RADIOGRAPHY: X-ray Knee Left Port  08/20/2011  *RADIOLOGY REPORT*  Clinical Data: Postop arthroplasty  PORTABLE LEFT KNEE - 1-2 VIEW  Comparison: 07/24/2007right knee radiographs  Findings: There are postoperative changes of total knee arthroplasty. Expected locules of gas in the joint and the surrounding subcutaneous soft tissues are noted.  Soft tissue drain projects over the lateral aspect of the distal femur.  Skin staples are seen anteriorly.  No fracture or hardware complication is identified.  IMPRESSION: Immediate postoperative changes of left knee arthroplasty.  No complicating features.  Original Report Authenticated By: Britta Mccreedy, M.D.       ASSESSMENT:  1. Anemia, with an element of anemia of chronic disease.  Responded readily to Procrit 40,000 units weekly. He is on PO iron 325 mg BID and tolerating well.  2. HTN  3. Osteoarthritis, S/P left complete knee arthroplasty on 08/20/2011  PLAN:  1. Lab work today: Ferritin, Iron/TIBC in order to make sure his iron is adequate. 2. Procrit 40,000 units today 3. Procrit 40,000 units weekly thereafter.  He will need to follow-up at [facility] for these injections upon discharge.  He will require his next injection on Tuesday 08/28/2011. 4. We will attempt to maintain a hemoglobin of 12-13 g/dL for 4-6 weeks post-operatively.  5. Follow-up  with an appointment at [facility] as an outpatient in approximately 4-5 weeks time. 6. Mild thrombocytopenia is likely secondary to consumption from surgical intervention.  All questions were answered. The patient knows to call the clinic with any problems, questions or concerns. We can certainly see the patient much sooner if necessary.  The patient and plan discussed with Glenford Peers, MD and he is in agreement with the aforementioned.  Yakima Kreitzer

## 2011-08-21 NOTE — Progress Notes (Signed)
Physical Therapy Treatment Patient Details Name: KINNEY SACKMANN MRN: [id number] DOB: [birth year] Today's Date: 08/21/2011  PT Assessment/Plan  PT - Assessment/Plan Comments on Treatment Session: ROM L knee AA is 18-68 deg.  Pain is well controlled and pt tolerates the CPM well.  Nursing asked to remove CPM at 8:45 PM. PT Plan: Discharge plan remains appropriate;Frequency remains appropriate PT Goals  Acute Rehab PT Goals PT Goal: Sit to Supine/Side - Progress: Progressing toward goal Pt will Transfer Bed to Chair/Chair to Bed: with min assist PT Transfer Goal: Bed to Chair/Chair to Bed - Progress: Updated due to goal met  PT Treatment Precautions/Restrictions  Precautions Precautions: Fall Required Braces or Orthoses: No Restrictions Weight Bearing Restrictions: No Mobility (including Balance)      Exercise    End of Session PT - End of Session Equipment Utilized During Treatment: Gait belt Activity Tolerance: Patient tolerated treatment well Patient left: in bed;with call bell in reach;in CPM Nurse Communication: Mobility status for transfers General Behavior During Session: [facility] for tasks performed Cognition: [facility] for tasks performed  Konrad Penta 08/21/2011, 4:50 PM

## 2011-08-21 NOTE — Progress Notes (Signed)
   CARE MANAGEMENT NOTE 08/21/2011  Patient:  GORGE, ALMANZA   Account Number:  [id number]  Date Initiated:  08/21/2011  Documentation initiated by:  Sharrie Rothman  Subjective/Objective Assessment:   Pt admitted from home with left knee osteoarthritis. Pt lives with wife who does have some dementia. Pt had total knee surgery for osteoarthritis.     Action/Plan:   Pt will need SNF for rehab at discharge. Pt and family has requested [facility] in [employer] [employer]. Pt and family is agreeable. CSW is aware and is actively searching for bed.   Anticipated DC Date:  08/27/2011   Anticipated DC Plan:  SKILLED NURSING FACILITY  In-house referral  Clinical Social Worker      DC Planning Services  CM consult      Choice offered to / List presented to:             Status of service:  Completed, signed off Medicare Important Message given?   (If response is "NO", the following Medicare IM given date fields will be blank) Date Medicare IM given:   Date Additional Medicare IM given:    Discharge Disposition:  SKILLED NURSING FACILITY  Per UR Regulation:    If discussed at Long Length of Stay Meetings, dates discussed:    Comments:

## 2011-08-21 NOTE — Progress Notes (Signed)
Physical Therapy Treatment Patient Details Name: Devon Griffin MRN: [id number] DOB: 05-03-1929 Today's Date: 08/21/2011  TIME: 1110-1122/ 1 TE   PT Assessment/Plan  PT - Assessment/Plan Comments on Treatment Session: Patient was not ready to transfer back to bed yet so exercises were done in the recliner. Elevated L heel to promote extension. PT will see again this afternoon to  transfer pt to bed and apply CPM PT Frequency: Min 6X/week Follow Up Recommendations: Skilled nursing facility Equipment Recommended: Defer to next venue PT Goals  Acute Rehab PT Goals PT Goal Formulation: With patient Time For Goal Achievement: 2 weeks Pt will go Supine/Side to Sit: with min assist PT Goal: Supine/Side to Sit - Progress: Goal set today Pt will go Sit to Supine/Side: with min assist PT Goal: Sit to Supine/Side - Progress: Goal set today Pt will go Sit to Stand: with mod assist PT Goal: Sit to Stand - Progress: Goal set today Pt will go Stand to Sit: with min assist PT Goal: Stand to Sit - Progress: Goal set today Pt will Transfer Bed to Chair/Chair to Bed: with mod assist PT Transfer Goal: Bed to Chair/Chair to Bed - Progress: Goal set today Pt will Ambulate: 1 - 15 feet;with max assist;with rolling walker PT Goal: Ambulate - Progress: Goal set today  PT Treatment Precautions/Restrictions  Precautions Precautions: Fall Required Braces or Orthoses: No Restrictions Weight Bearing Restrictions: No Mobility (including Balance) Bed Mobility Bed Mobility: No Supine to Sit: HOB elevated (Comment degrees);3: Mod assist Supine to Sit Details (indicate cue type and reason): HOB at 60 deg Transfers Transfers: No Sit to Stand: 2: Max assist;With upper extremity assist;From bed Sit to Stand Details (indicate cue type and reason): bed elevated...pt unable to achieve full stance with R knee flexed and trunk flexed over walker Stand to Sit: 3: Mod assist Stand to Sit Details: to control  descent of trunk Ambulation/Gait Ambulation/Gait: No (unable) Stairs: No  Posture/Postural Control Posture/Postural Control: No significant limitations Balance Balance Assessed: No Exercise  Total Joint Exercises Ankle Circles/Pumps: AROM;Both;10 reps;Supine Quad Sets: Both;10 reps;Seated Short Arc Quad: Left;10 reps;Seated Heel Slides: Seated;15 reps Straight Leg Raises: AAROM;Left;10 reps End of Session PT - End of Session Equipment Utilized During Treatment: Gait belt Activity Tolerance: Patient tolerated treatment well Patient left: in chair;with call bell in reach;with family/visitor present Nurse Communication: Mobility status for transfers General Behavior During Session: [facility] for tasks performed Cognition: [facility] for tasks performed  Devon Griffin 08/21/2011, 11:32 AM

## 2011-08-21 NOTE — Anesthesia Postprocedure Evaluation (Signed)
Anesthesia Post Note  Patient: Devon Griffin  Procedure(s) Performed: Procedure(s) (LRB): TOTAL KNEE ARTHROPLASTY (Left)  Anesthesia type: Spinal  Patient location: 306  Post pain: Pain level controlled  Post assessment: Post-op Vital signs reviewed, Patient's Cardiovascular Status Stable, Respiratory Function Stable, Patent Airway, No signs of Nausea or vomiting and Pain level controlled  Last Vitals:  Filed Vitals:   08/21/11 0630  BP: 94/59  Pulse: 66  Temp: 36.8 C  Resp: 20    Post vital signs: Reviewed and stable  Level of consciousness: awake and alert   Complications: No apparent anesthesia complications

## 2011-08-21 NOTE — Progress Notes (Addendum)
Subjective: 1 Day Post-Op Procedure(s) (LRB): TOTAL KNEE ARTHROPLASTY (Left) Patient reports pain as mild.    Objective: Vital signs in last 24 hours: Temp:  [97.6 F (36.4 C)-98.4 F (36.9 C)] 98.2 F (36.8 C) (04/09 0630) Pulse Rate:  [56-86] 66  (04/09 0630) Resp:  [15-24] 20  (04/09 0630) BP: (78-138)/(34-74) 94/59 mmHg (04/09 0630) SpO2:  [94 %-100 %] 95 % (04/09 0630) Weight:  [89.812 kg (198 lb)] 89.812 kg (198 lb) (04/08 1140)  Intake/Output from previous day: 04/08 0701 - 04/09 0700 In: 2300 [P.O.:600; I.V.:1700] Out: 2050 [Urine:1900; Blood:150] Intake/Output this shift:     Basename 08/21/11 0445  HGB 8.8*    Basename 08/21/11 0445  WBC 9.1  RBC 2.90*  HCT 28.1*  PLT 106*    Basename 08/21/11 0445  NA 137  K 4.2  CL 103  CO2 26  BUN 33*  CREATININE 1.47*  GLUCOSE 141*  CALCIUM 8.7   No results found for this basename: LABPT:2,INR:2 in the last 72 hours  Neurologically intact Neurovascular intact Sensation intact distally Intact pulses distally Dorsiflexion/Plantar flexion intact Incision: dressing C/D/I  Assessment/Plan: 1 Day Post-Op Procedure(s) (LRB): TOTAL KNEE ARTHROPLASTY (Left) Advance diet Up with therapy  Devon Griffin 08/21/2011, 7:55 AM

## 2011-08-21 NOTE — Addendum Note (Signed)
Addendum  created 08/21/11 0816 by Franco Nones, CRNA   Modules edited:Notes Section

## 2011-08-21 NOTE — Evaluation (Signed)
Physical Therapy Evaluation Patient Details Name: Devon Griffin MRN: [id number] DOB: 05/02/1929 Today's Date: 08/21/2011  Problem List:  Patient Active Problem List  Diagnoses  . OA (osteoarthritis) of knee  . Anemia    Past Medical History:  Past Medical History  Diagnosis Date  . Anemia   . Hypertension   . Arthritis   . Anemia 06/19/2011   Past Surgical History:  Past Surgical History  Procedure Date  . Joint replacement     right knee  . Appendectomy   . Hernia repair     bilateral inguinal repair    PT Assessment/Plan/Recommendation PT Assessment Clinical Impression Statement: Pt very pleasant and cooperative, now with L TKR.  ROM L knee is 20-62 deg AA, pt educated on importance of positioning LLE to facilitate increased knee ext..  Max assist needed to transfer bed to chair (he is unable to achieve stance with walker due to weakness).  He will need SNF at d/c and is agreeable to it.  Will follow per TKR protocol. PT Recommendation/Assessment: Patient will need skilled PT in the acute care venue PT Problem List: Decreased strength;Decreased range of motion;Decreased activity tolerance;Decreased mobility;Decreased knowledge of use of DME;Decreased knowledge of precautions;Pain Barriers to Discharge: Decreased caregiver support PT Therapy Diagnosis : Difficulty walking;Generalized weakness;Acute pain PT Plan PT Frequency: Min 6X/week PT Treatment/Interventions: DME instruction;Gait training;Functional mobility training;Therapeutic activities;Therapeutic exercise;Patient/family education PT Recommendation Follow Up Recommendations: Skilled nursing facility Equipment Recommended: Defer to next venue PT Goals  Acute Rehab PT Goals PT Goal Formulation: With patient Time For Goal Achievement: 2 weeks Pt will go Supine/Side to Sit: with min assist PT Goal: Supine/Side to Sit - Progress: Goal set today Pt will go Sit to Supine/Side: with min assist PT Goal: Sit to  Supine/Side - Progress: Goal set today Pt will go Sit to Stand: with mod assist PT Goal: Sit to Stand - Progress: Goal set today Pt will go Stand to Sit: with min assist PT Goal: Stand to Sit - Progress: Goal set today Pt will Transfer Bed to Chair/Chair to Bed: with mod assist PT Transfer Goal: Bed to Chair/Chair to Bed - Progress: Goal set today Pt will Ambulate: 1 - 15 feet;with max assist;with rolling walker PT Goal: Ambulate - Progress: Goal set today  PT Evaluation Precautions/Restrictions  Precautions Precautions: Fall Required Braces or Orthoses: No Restrictions Weight Bearing Restrictions: No Prior Functioning  Home Living Lives With: Spouse Receives Help From: Family Type of Home: House Home Layout: One level Home Access: Level entry Home Adaptive Equipment: Bedside commode/3-in-1;Walker - rolling;Straight cane Prior Function Level of Independence: Independent with basic ADLs;Independent with homemaking with ambulation;Independent with gait;Independent with transfers;Requires assistive device for independence (cane) Driving: Yes Vocation: Retired Financial risk analyst Arousal/Alertness: Awake/alert Overall Cognitive Status: Appears within functional limits for tasks assessed Orientation Level: Oriented X4 Sensation/Coordination Sensation Light Touch: Appears Intact Stereognosis: Not tested Hot/Cold: Not tested Proprioception: Appears Intact Coordination Gross Motor Movements are Fluid and Coordinated: Yes Fine Motor Movements are Fluid and Coordinated: Yes Extremity Assessment RUE Assessment RUE Assessment: Within Functional Limits LUE Assessment LUE Assessment: Within Functional Limits RLE Assessment RLE Assessment: Within Functional Limits LLE Assessment LLE Assessment: Exceptions to WFL LLE PROM (degrees) Left Knee Extension 0-130: -20 Left Knee Flexion 0-140: 62 Mobility (including Balance) Bed Mobility Bed Mobility: Yes Supine to Sit: HOB elevated  (Comment degrees);3: Mod assist Supine to Sit Details (indicate cue type and reason): HOB at 60 deg Transfers Transfers: Yes Sit to Stand: 2: Max assist;With  upper extremity assist;From bed Sit to Stand Details (indicate cue type and reason): bed elevated...pt unable to achieve full stance with R knee flexed and trunk flexed over walker Stand to Sit: 3: Mod assist Stand to Sit Details: to control descent of trunk Ambulation/Gait Ambulation/Gait: No (unable) Stairs: No  Posture/Postural Control Posture/Postural Control: No significant limitations Balance Balance Assessed: No Exercise  Total Joint Exercises Ankle Circles/Pumps: AROM;Both;10 reps;Supine Quad Sets: AROM;Both;10 reps;Supine Short Arc Quad: AAROM;Left;10 reps;Supine Heel Slides: AAROM;Left;15 reps;Supine End of Session PT - End of Session Equipment Utilized During Treatment: Gait belt Activity Tolerance: Patient tolerated treatment well Patient left: in chair;with call bell in reach Nurse Communication: Mobility status for transfers General Behavior During Session: [facility] for tasks performed Cognition: [facility] for tasks performed  Konrad Penta 08/21/2011, 9:39 AM

## 2011-08-22 ENCOUNTER — Encounter (HOSPITAL_COMMUNITY): Payer: Self-pay | Admitting: Orthopedic Surgery

## 2011-08-22 LAB — CBC
MCV: 95.9 fL (ref 78.0–100.0)
Platelets: 102 10*3/uL — ABNORMAL LOW (ref 150–400)
RBC: 2.46 MIL/uL — ABNORMAL LOW (ref 4.22–5.81)
RDW: 14.9 % (ref 11.5–15.5)
WBC: 15.9 10*3/uL — ABNORMAL HIGH (ref 4.0–10.5)

## 2011-08-22 MED ORDER — SODIUM CHLORIDE 0.9 % IJ SOLN
INTRAMUSCULAR | Status: AC
  Administered 2011-08-22: 10 mL
  Filled 2011-08-22: qty 3

## 2011-08-22 NOTE — Progress Notes (Addendum)
Physical Therapy Treatment Patient Details Name: Devon Griffin MRN: [id number] DOB: 09-Mar-1929 Today's Date: 08/22/2011  TIME: [id number]/ 1 TE 1 TA  PT Assessment/Plan  PT - Assessment/Plan Comments on Treatment Session: AROM knee ext 20 degrees;AAROM seated knee flex 70 degrees; Pt was able to compete 5 sit<>stand transfers today requiring Max A due to pain and weakness of LLE. Patient has difficulty  following proper technique with RW wanting to pull on RW to stand. Patient not steady upon standing lateral leaning to LLE (surgery LE) gait was not attempted; will attempt again this afternoon ROM ext -20 PT Goals  Acute Rehab PT Goals PT Goal: Sit to Supine/Side - Progress: Met PT Goal: Sit to Stand - Progress: Progressing toward goal PT Goal: Ambulate - Progress: Not met  PT Treatment Precautions/Restrictions  Precautions Precautions: Fall Required Braces or Orthoses: No Restrictions Weight Bearing Restrictions:  (WBAT) Mobility (including Balance) Bed Mobility Supine to Sit: 4: Min assist;HOB elevated (Comment degrees) (HOB elevated;40 degrees) Supine to Sit Details (indicate cue type and reason): assistaance needed with LLE Sit to Supine: 3: Mod assist Sit to Supine - Details (indicate cue type and reason): assistance with both LEs needed Transfers Transfers: Yes Sit to Stand: 2: Max assist Sit to Stand Details (indicate cue type and reason): Pt needing multiple cues for hand placement and thoracic extension; verbal cues to shift weight to RLE (non surgery LE);bed elevated  Stand to Sit: 4: Min assist Lateral/Scoot Transfers: 6: Modified independent (Device/Increase time) (to [facility] x2) Ambulation/Gait Ambulation/Gait: No Stairs: No Wheelchair Mobility Wheelchair Mobility: No    Exercise  General Exercises - Lower Extremity Ankle Circles/Pumps: 15 reps;Both Quad Sets: Both;10 reps Gluteal Sets: 10 reps Short Arc Quad: AAROM;10 reps (patient only doing 5-10%) Heel  Slides: AAROM;Seated;Left;15 reps Other Exercises Other Exercises: sit <>stand x5 Bridging  With RLE x10  End of Session PT - End of Session Equipment Utilized During Treatment: Gait belt Activity Tolerance: Patient limited by pain Patient left: in bed;with call bell in reach;with bed alarm set Nurse Communication: Mobility status for transfers General Behavior During Session: [facility] for tasks performed Cognition: [facility] for tasks performed  Devon Griffin 08/22/2011, 10:35 AM

## 2011-08-22 NOTE — Progress Notes (Signed)
Subjective: The patient denies any complaints this AM.  He reports that he had a little bit of nausea last night.  This could be medication induced nausea.  He does have Zofran ordered.  I explained to the patient that he should advise his nurse if he experiences any nausea or vomiting.   I suppose there was a little bit of a communication break-down between the hospital and the patient's niece who is active in the patient's care.  She reports that she was informed that her uncle, Devon Griffin, was at the [facility] and required some clothing.  I do not know the specifics, but the niece did express some frustrations.  I explained to the patient that social worker usually facilitates the transfer of patients to rehabilitation facilities and I recommended she touch base with them.  Otherwise, the patient did receive his Procrit 40,000 units injection yesterday (08/21/2011) at 1537 hrs. He will be due for hs subsequent injection on 08/28/2011.  Upon discharge, these appointment should be arranged by the discharging physician by contacting the [facility].   Objective: Vital signs in last 24 hours: Temp:  [98 F (36.7 C)-99.1 F (37.3 C)] 98.4 F (36.9 C) (04/10 0501) Pulse Rate:  [96-112] 96  (04/10 0501) Resp:  [18] 18  (04/10 0501) BP: (90-118)/(49-72) 91/56 mmHg (04/10 0501) SpO2:  [92 %-97 %] 97 % (04/10 0501)  Intake/Output from previous day: 04/09 0800 - 04/10 0759 In: 1040 [P.O.:1040] Out: 450 [Urine:450] Intake/Output this shift:    General appearance: alert, cooperative, appears stated age and no distress Extremities: Left patellar surgical site clean with staples in place.  Medial knee blister appreciated.  Left LE swelling noted on inspection.  Lab Results:   Basename 08/22/11 0444 08/21/11 0445  WBC 15.9* 9.1  HGB 7.4* 8.8*  HCT 23.6* 28.1*  PLT 102* 106*   BMET  Basename 08/21/11 0445  NA 137  K 4.2  CL 103  CO2 26  GLUCOSE 141*  BUN 33*  CREATININE 1.47*    CALCIUM 8.7    Studies/Results: X-ray Knee Left Port  08/20/2011  *RADIOLOGY REPORT*  Clinical Data: Postop arthroplasty  PORTABLE LEFT KNEE - 1-2 VIEW  Comparison: 07/24/2007right knee radiographs  Findings: There are postoperative changes of total knee arthroplasty. Expected locules of gas in the joint and the surrounding subcutaneous soft tissues are noted.  Soft tissue drain projects over the lateral aspect of the distal femur.  Skin staples are seen anteriorly.  No fracture or hardware complication is identified.  IMPRESSION: Immediate postoperative changes of left knee arthroplasty.  No complicating features.  Original Report Authenticated By: Britta Mccreedy, M.D.    Medications: I have reviewed the patient's current medications.  Assessment/Plan: 1. Anemia of chronic disease, and an element of anemia secondary to acute blood loss, S/P total left knee arthroplasty on 08/20/2011 by Dr. Romeo Apple.  He is on ferrous sulfate PO.  Now on Procrit 40,000 units for hemoglobin support during the healing process of the aforementioned surgical procedure.  Hemoglobin goal of 12-13 g/dL for 4-6 weeks following surgery with Procrit injections. Patient received Procrit 40,000 units on 08/21/2011 and will be due for his next weekly injection on 08/28/2011.  This weekly supportive plan was built and orders signed for outpatient administration of the medication. His next injection is scheduled for 08/28/2011 at 12 noon.  Appointments were made for these injections by our scheduler and follow-up appointment.  His appointment list was printed and provided to the patient  and a copy was given to the nurse to provide to the patient's niece at time of discharge. 2. Osteoarthritis, S/P left complete knee arthroplasty on 08/20/2011. 3. Left leg blisters secondary to poorly positioned cryo/cuff per Dr. Mort Sawyers note.  It has been removed and discontinued.    LOS: 2 days    Devon Griffin 08/22/2011

## 2011-08-22 NOTE — Progress Notes (Signed)
Subjective: 2 Days Post-Op Procedure(s) (LRB): TOTAL KNEE ARTHROPLASTY (Left) Patient reports pain as mild.    Objective: Vital signs in last 24 hours: Temp:  [98 F (36.7 C)-99.1 F (37.3 C)] 98.4 F (36.9 C) (04/10 0501) Pulse Rate:  [96-112] 96  (04/10 0501) Resp:  [18] 18  (04/10 0501) BP: (90-118)/(49-72) 91/56 mmHg (04/10 0501) SpO2:  [92 %-97 %] 97 % (04/10 0501)  Intake/Output from previous day: 04/09 0701 - 04/10 0700 In: 1040 [P.O.:1040] Out: 450 [Urine:450] Intake/Output this shift:     Basename 08/22/11 0444 08/21/11 0445  HGB 7.4* 8.8*    Basename 08/22/11 0444 08/21/11 0445  WBC 15.9* 9.1  RBC 2.46* 2.90*  HCT 23.6* 28.1*  PLT 102* 106*    Basename 08/21/11 0445  NA 137  K 4.2  CL 103  CO2 26  BUN 33*  CREATININE 1.47*  GLUCOSE 141*  CALCIUM 8.7   No results found for this basename: LABPT:2,INR:2 in the last 72 hours  Multiple large blisters are present secondary to inappropriate positioning of a Cryo/Cuff. Cryo/Cuff was removed and has been discontinued. Orders were placed to not place a Cryo/Cuff on the table or on the stand and to keep it on the floor. Order was not followed resulting in multiple blisters.  Assessment/Plan: 2 Days Post-Op Procedure(s) (LRB): TOTAL KNEE ARTHROPLASTY (Left) Up with therapy Plan for discharge tomorrow Discharge to SNF Transfuse 2 units of blood   Humbarger Canada 08/22/2011, 7:49 AM

## 2011-08-22 NOTE — Progress Notes (Signed)
Physical Therapy Treatment Patient Details Name: Devon Griffin MRN: [id number] DOB: 1929/03/09 Today's Date: 08/22/2011  TIME: 1342-1416/ 1 TE 1 TA  PT Assessment/Plan  PT - Assessment/Plan Comments on Treatment Session: Bed exercises completed, seated HS produce best flexion 70degrees however pain inhibits further ROM;CPM applied and needs to be taken off at 2210. Sit <>stand not attempted due to earlier session fatiguing pt and blood still being admin. Hopefully patient will have better strength in am PT Goals  Acute Rehab PT Goals PT Goal: Sit to Supine/Side - Progress: Met PT Goal: Sit to Stand - Progress: Progressing toward goal PT Goal: Ambulate - Progress: Not met  PT Treatment Precautions/Restrictions  Precautions Precautions: Fall Required Braces or Orthoses: No Restrictions Weight Bearing Restrictions:  (WBAT) Mobility (including Balance) Bed Mobility Supine to Sit: 4: Min assist Sit to Supine: 3: Mod assist Transfers Transfers: No Lateral/Scoot Transfers: 6: Modified independent (Device/Increase time) (to [facility] x2)    Exercise  General Exercises - Lower Extremity Ankle Circles/Pumps: Both;20 reps Quad Sets: Left;10 reps Gluteal Sets: 10 reps Short Arc Quad: AAROM;10 reps (patient only doing 5-10%) Heel Slides: Seated;Left;10 reps Other Exercises Other Exercises: sit <>stand x5 End of Session PT - End of Session Activity Tolerance: Patient limited by pain;Patient limited by fatigue Patient left: in bed;in CPM Nurse Communication: Mobility status for transfers General Behavior During Session: [name] for tasks performed Cognition: [facility] for tasks performed  Devon Griffin 08/22/2011, 2:25 PM

## 2011-08-23 LAB — TYPE AND SCREEN: Unit division: 0

## 2011-08-23 LAB — CBC
HCT: 22.5 % — ABNORMAL LOW (ref 39.0–52.0)
Hemoglobin: 7.4 g/dL — ABNORMAL LOW (ref 13.0–17.0)
RBC: 2.42 MIL/uL — ABNORMAL LOW (ref 4.22–5.81)
WBC: 12.7 10*3/uL — ABNORMAL HIGH (ref 4.0–10.5)

## 2011-08-23 MED ORDER — ASPIRIN 325 MG PO TBEC: 325.0000 mg | DELAYED_RELEASE_TABLET | Freq: Two times a day (BID) | ORAL | Status: DC

## 2011-08-23 MED ORDER — HYDROCODONE-ACETAMINOPHEN 5-325 MG PO TABS: 1.0000 | ORAL_TABLET | Freq: Four times a day (QID) | ORAL | Status: DC | PRN

## 2011-08-23 MED ORDER — EPOETIN ALFA 40000 UNIT/ML IJ SOLN: 40000.0000 [IU] | INTRAMUSCULAR | Status: DC

## 2011-08-23 MED ORDER — TRAMADOL HCL 50 MG PO TABS: 50.0000 mg | ORAL_TABLET | Freq: Four times a day (QID) | ORAL | Status: DC

## 2011-08-23 NOTE — Progress Notes (Signed)
   CARE MANAGEMENT NOTE 08/23/2011  Patient:  Devon Griffin, Devon Griffin   Account Number:  [id number]  Date Initiated:  08/21/2011  Documentation initiated by:  Sharrie Rothman  Subjective/Objective Assessment:   Pt admitted from home with left knee osteoarthritis. Pt lives with wife who does have some dementia. Pt had total knee surgery for osteoarthritis.     Action/Plan:   Pt will need SNF for rehab at discharge. Pt and family has requested [facility] in [place]. Pt and family is agreeable. CSW is aware and is actively searching for bed.   Anticipated DC Date:  08/27/2011   Anticipated DC Plan:  SKILLED NURSING FACILITY  In-house referral  Clinical Social Worker      DC Planning Services  CM consult      Choice offered to / List presented to:             Status of service:  Completed, signed off Medicare Important Message given?  YES (If response is "NO", the following Medicare IM given date fields will be blank) Date Medicare IM given:  08/23/2011 Date Additional Medicare IM given:    Discharge Disposition:  SKILLED NURSING FACILITY  Per UR Regulation:    If discussed at Long Length of Stay Meetings, dates discussed:    Comments:

## 2011-08-23 NOTE — Discharge Instructions (Signed)
Patient will not have a CPM machine  Dressings changed every day with Xeroform 4 x 4's Kerlix and Ace wrap.  Ecotrin 4 Weeks twice a day for DVT prophylaxis  Full weightbearing as tolerated  Active passive assisted range of motion for physical therapy

## 2011-08-23 NOTE — Discharge Summary (Signed)
Physician Discharge Summary  Patient ID: TRE SANKER MRN: [id number] DOB/AGE: [birth year] 78 y.o.  Admit date: 08/20/2011 Discharge date: 08/23/2011  Admission Diagnoses: Left knee arthritis  Discharge Diagnoses: Left knee arthritis Postoperative anemia acute on chronic   Active Problems:  * No active hospital problems. *    Discharged Condition: stable  Hospital Course: The patient was admitted on April 8 for an uncomplicated left total knee arthroplasty with the Depue posterior stabilized Sigma knee replacement. He tolerated this well  In the postoperative period he developed multiple blisters secondary to inappropriate use of a Cryo/Cuff. His chronic anemia resurfaced. He was restarted on Procrit after a consultation was obtained with the hematologist. He received blood transfusion. His hemoglobin is chronically low and is currently at 7.4 and is asymptomatic.  He is on iron.  The blisters were treated with dressing changes.  He tolerated physical therapy well.  Consults: hematology/oncology  Significant Diagnostic Studies: labs:   CBC    Component Value Date/Time   WBC 12.7* 08/23/2011 0510   RBC 2.42* 08/23/2011 0510   HGB 7.4* 08/23/2011 0510   HCT 22.5* 08/23/2011 0510   PLT 103* 08/23/2011 0510   MCV 93.0 08/23/2011 0510   MCH 30.6 08/23/2011 0510   MCHC 32.9 08/23/2011 0510   RDW 15.4 08/23/2011 0510   LYMPHSABS 1.1 08/14/2011 1020   MONOABS 0.3 08/14/2011 1020   EOSABS 0.2 08/14/2011 1020   BASOSABS 0.0 08/14/2011 1020      Treatments: IV hydration, surgery: LEFT TKA and PROCRIT  Discharge Exam: Blood pressure 96/54, pulse 92, temperature 98 F (36.7 C), temperature source Oral, resp. rate 18, height 6\' 1"  (1.854 m), weight 89.812 kg (198 lb), SpO2 91.00%. General appearance: alert, cooperative, appears stated age and no distress Neurologic: Grossly normal Incision/Wound: the skin incision is actually clean dry and intact. On the medial side of his leg is a large  blister which formed and is also on distal and lateral. These have been decompressed and covered with dressings.  Disposition: [facility]  Discharge Orders    Future Appointments: Provider: Department: Dept Phone: Center:   08/28/2011 12:00 PM [facility] [phone] None   09/03/2011 2:00 PM Vickki Hearing, MD [facility] [phone] [facility]   09/04/2011 12:00 PM [facility] [phone] None   09/11/2011 12:00 PM [facility] [phone] None   09/18/2011 10:00 AM Ellouise Newer, PA [facility] [phone] None     Future Orders Please Complete By Expires   Diet - low sodium heart healthy      Call MD / Call 911      Comments:   If you experience chest pain or shortness of breath, CALL 911 and be transported to the hospital emergency room.  If you develope a fever above 101 F, pus (white drainage) or increased drainage or redness at the wound, or calf pain, call your surgeon's office.   Constipation Prevention      Comments:   Drink plenty of fluids.  Prune juice may be helpful.  You may use a stool softener, such as Colace (over the counter) 100 mg twice a day.  Use MiraLax (over the counter) for constipation as needed.   Increase activity slowly as tolerated      Weight Bearing as taught in Physical Therapy      Comments:   Use a walker or crutches as instructed.   CPM  Comments:   NO CPM   Change dressing      Comments:   Change dressing daily. Apply Xeroform over the areas of blistering. Follow with 4 x 4 dressings Kerlix wrap and Ace wrap starting at his toes up to his mid thigh level.   Do not put a pillow under the knee. Place it under the heel.      Discharge instructions      Comments:   DVT prophylaxis Ecotrin twice a day for the next 5 weeks  No CPM machine secondary to the blisters.  Full weightbearing.  Passive and active assisted range of motion with  quadriceps strengthening exercises.  Followup visit has been arranged for April 22ND     Medication List  As of 08/23/2011  7:42 AM   TAKE these medications         aspirin 325 MG EC tablet   Take 1 tablet (325 mg total) by mouth 2 (two) times daily.      docusate sodium 100 MG capsule   Commonly known as: COLACE   Take 100 mg by mouth 2 (two) times daily.      epoetin alfa 40981 UNIT/ML injection   Commonly known as: EPOGEN,PROCRIT   Inject 1 mL (40,000 Units total) into the skin once a week.      ferrous sulfate 325 (65 FE) MG tablet   Take 325 mg by mouth 2 (two) times daily.      HYDROcodone-acetaminophen 5-325 MG per tablet   Commonly known as: NORCO   Take 1 tablet by mouth every 6 (six) hours as needed. Pain      lisinopril-hydrochlorothiazide 20-25 MG per tablet   Commonly known as: PRINZIDE,ZESTORETIC   Take 1 tablet by mouth daily.      traMADol 50 MG tablet   Commonly known as: ULTRAM   Take 1 tablet (50 mg total) by mouth every 6 (six) hours.           Follow-up Information    Follow up with Shahla Betsill in 4 weeks. (Follow-up appointment)       Follow up with Randall An, MD in 1 week. (For weekly Procrit injection.  Next injection due on 08/28/2011)    Contact information:   [address] [place] [address] [phone]       Follow up with Clabo Canada, MD. (April 22)    Contact information:   [address] [place] [address] [phone]          Signed: Breed Canada 08/23/2011, 7:42 AM

## 2011-08-23 NOTE — Progress Notes (Signed)
Patient for d/c today to SNF bed at [facility]. Patient and family pleased with location and plans- plan transfer via EMS @3pm . Reece Levy, MSW, Theresia Majors [phone]

## 2011-08-23 NOTE — Progress Notes (Signed)
Report called to Abe People, LPN with [facility] in [place], [place]. Discharge instruction and social worker packet sent with EMS. Patient in stable condition and transferred by EMS.

## 2011-08-28 ENCOUNTER — Ambulatory Visit (HOSPITAL_COMMUNITY): Payer: Medicare Other

## 2011-08-28 ENCOUNTER — Ambulatory Visit (HOSPITAL_COMMUNITY): Payer: Medicare Other | Admitting: Oncology

## 2011-09-03 ENCOUNTER — Ambulatory Visit (INDEPENDENT_AMBULATORY_CARE_PROVIDER_SITE_OTHER): Payer: Medicare Other | Admitting: Orthopedic Surgery

## 2011-09-03 ENCOUNTER — Encounter: Payer: Self-pay | Admitting: Orthopedic Surgery

## 2011-09-03 VITALS — BP 100/60 | Ht 73.0 in | Wt 198.0 lb

## 2011-09-03 DIAGNOSIS — Z96659 Presence of unspecified artificial knee joint: Secondary | ICD-10-CM

## 2011-09-03 NOTE — Patient Instructions (Signed)
Continue dressings   Continue PT

## 2011-09-03 NOTE — Progress Notes (Signed)
Patient ID: Devon Griffin, male   DOB: 03/10/1929, 78 y.o.   MRN: [id number] Chief Complaint  Patient presents with  . Routine Post Op    post op1 left knee TKA, DOS 08/20/11    Postop visit status post total knee arthroplasty Date of surgery  4.8.13 Diagnosis  Osteoarthritis Operative findings severe oa  Implant Depuy  posterior stabilized total knee replacement DVT prophylaxis Ecotrin twice a day with TED hose for 6 weeks  Living situation [place]  Complaints none  Exam The patient developed blisters from an appropriately applied Cryo/Cuff and they have gotten much better with appropriate dressing changes. Staples are removed.   Plan Anticipate discharge by the end of the week. If he is independent with daily care. Followup 2 weeks

## 2011-09-04 ENCOUNTER — Encounter (HOSPITAL_COMMUNITY): Payer: Medicare Other | Attending: Oncology

## 2011-09-04 DIAGNOSIS — D649 Anemia, unspecified: Secondary | ICD-10-CM | POA: Insufficient documentation

## 2011-09-04 LAB — CBC
Hemoglobin: 8 g/dL — ABNORMAL LOW (ref 13.0–17.0)
MCH: 30.1 pg (ref 26.0–34.0)
MCV: 97.7 fL (ref 78.0–100.0)
Platelets: 468 10*3/uL — ABNORMAL HIGH (ref 150–400)
RBC: 2.66 MIL/uL — ABNORMAL LOW (ref 4.22–5.81)
WBC: 11.7 10*3/uL — ABNORMAL HIGH (ref 4.0–10.5)

## 2011-09-04 MED ORDER — EPOETIN ALFA 40000 UNIT/ML IJ SOLN
INTRAMUSCULAR | Status: AC
  Filled 2011-09-04: qty 1

## 2011-09-04 MED ORDER — EPOETIN ALFA 40000 UNIT/ML IJ SOLN
40000.0000 [IU] | Freq: Once | INTRAMUSCULAR | Status: AC
  Administered 2011-09-04: 40000 [IU] via SUBCUTANEOUS

## 2011-09-04 NOTE — Progress Notes (Signed)
1250 [place] obtained peripherally for lab..  Procrit 40,000 units given sub-Q to left arm.  Tolerated well.

## 2011-09-11 ENCOUNTER — Encounter (HOSPITAL_BASED_OUTPATIENT_CLINIC_OR_DEPARTMENT_OTHER): Payer: Medicare Other

## 2011-09-11 VITALS — BP 102/58 | HR 94

## 2011-09-11 DIAGNOSIS — D649 Anemia, unspecified: Secondary | ICD-10-CM

## 2011-09-11 LAB — CBC
HCT: 30.4 % — ABNORMAL LOW (ref 39.0–52.0)
Hemoglobin: 9.3 g/dL — ABNORMAL LOW (ref 13.0–17.0)
MCHC: 30.6 g/dL (ref 30.0–36.0)
MCV: 101 fL — ABNORMAL HIGH (ref 78.0–100.0)
RDW: 18.6 % — ABNORMAL HIGH (ref 11.5–15.5)

## 2011-09-11 MED ORDER — EPOETIN ALFA 40000 UNIT/ML IJ SOLN
INTRAMUSCULAR | Status: AC
  Filled 2011-09-11: qty 1

## 2011-09-11 MED ORDER — EPOETIN ALFA 40000 UNIT/ML IJ SOLN
40000.0000 [IU] | Freq: Once | INTRAMUSCULAR | Status: AC
  Administered 2011-09-11: 40000 [IU] via SUBCUTANEOUS

## 2011-09-11 NOTE — Progress Notes (Signed)
Venipuncture left arm for specimen for lab.   Procrit 40,000 units subcutaneous lower right abd.  Tolerated well.

## 2011-09-17 ENCOUNTER — Other Ambulatory Visit: Payer: Self-pay | Admitting: Orthopedic Surgery

## 2011-09-17 DIAGNOSIS — D649 Anemia, unspecified: Secondary | ICD-10-CM

## 2011-09-17 MED ORDER — HYDROCODONE-ACETAMINOPHEN 5-325 MG PO TABS: 1.0000 | ORAL_TABLET | Freq: Four times a day (QID) | ORAL | Status: DC | PRN

## 2011-09-18 ENCOUNTER — Encounter: Payer: Self-pay | Admitting: Orthopedic Surgery

## 2011-09-18 ENCOUNTER — Ambulatory Visit (HOSPITAL_COMMUNITY): Payer: Medicare Other

## 2011-09-18 ENCOUNTER — Ambulatory Visit (INDEPENDENT_AMBULATORY_CARE_PROVIDER_SITE_OTHER): Payer: Medicare Other | Admitting: Orthopedic Surgery

## 2011-09-18 ENCOUNTER — Encounter (HOSPITAL_COMMUNITY): Payer: Medicare Other

## 2011-09-18 ENCOUNTER — Encounter (HOSPITAL_COMMUNITY): Payer: Medicare Other | Attending: Oncology | Admitting: Oncology

## 2011-09-18 VITALS — BP 133/83 | HR 81 | Temp 98.0°F | Wt 184.5 lb

## 2011-09-18 VITALS — BP 100/70 | Ht 73.0 in | Wt 184.0 lb

## 2011-09-18 DIAGNOSIS — M1990 Unspecified osteoarthritis, unspecified site: Secondary | ICD-10-CM

## 2011-09-18 DIAGNOSIS — M129 Arthropathy, unspecified: Secondary | ICD-10-CM | POA: Insufficient documentation

## 2011-09-18 DIAGNOSIS — I10 Essential (primary) hypertension: Secondary | ICD-10-CM | POA: Insufficient documentation

## 2011-09-18 DIAGNOSIS — Z96659 Presence of unspecified artificial knee joint: Secondary | ICD-10-CM | POA: Insufficient documentation

## 2011-09-18 DIAGNOSIS — D649 Anemia, unspecified: Secondary | ICD-10-CM

## 2011-09-18 DIAGNOSIS — D638 Anemia in other chronic diseases classified elsewhere: Secondary | ICD-10-CM

## 2011-09-18 LAB — CBC
HCT: 33.7 % — ABNORMAL LOW (ref 39.0–52.0)
Hemoglobin: 10.3 g/dL — ABNORMAL LOW (ref 13.0–17.0)
RDW: 18.6 % — ABNORMAL HIGH (ref 11.5–15.5)
WBC: 5.1 10*3/uL (ref 4.0–10.5)

## 2011-09-18 MED ORDER — EPOETIN ALFA 40000 UNIT/ML IJ SOLN
40000.0000 [IU] | Freq: Once | INTRAMUSCULAR | Status: AC
  Administered 2011-09-18: 40000 [IU] via SUBCUTANEOUS

## 2011-09-18 MED ORDER — EPOETIN ALFA 40000 UNIT/ML IJ SOLN
INTRAMUSCULAR | Status: AC
  Filled 2011-09-18: qty 1

## 2011-09-18 NOTE — Progress Notes (Signed)
Devon Griffin presented for labwork. Labs per MD order drawn via Peripheral Line 23 gauge needle inserted in left antecubital.  Good blood return present. Procedure without incident.  Needle removed intact. Patient tolerated procedure well.  Devon Griffin presents today for injection per MD orders. Procrit 40,000 units administered SQ in right Abdomen. Administration without incident. Patient tolerated well.

## 2011-09-18 NOTE — Patient Instructions (Signed)
[facility]  Discharge Instructions  RECOMMENDATIONS MADE BY THE CONSULTANT AND ANY TEST RESULTS WILL BE SENT TO YOUR REFERRING DOCTOR.   We will continue weekly Procrit injections and lab work for the next four weeks. You will also see the doctor again at your 4 week injection appointment. Please tell Dr.Harrison today about the pain in your leg.   I acknowledge that I have been informed and understand all the instructions given to me and received a copy. I do not have any more questions at this time, but understand that I may call the Specialty Clinic at [facility] at [phone] during business hours should I have any further questions or need assistance in obtaining follow-up care.    __________________________________________  _____________  __________ Signature of Patient or Authorized Representative            Date                   Time    __________________________________________ Nurse's Signature

## 2011-09-18 NOTE — Patient Instructions (Addendum)
continue rehab.

## 2011-09-18 NOTE — Progress Notes (Signed)
Bobbye Riggs, NP, NP [address] [place] [address]  1. Anemia  SCHEDULING COMMUNICATION INJECTION, CBC, SCHEDULING COMMUNICATION LAB, epoetin alfa (EPOGEN,PROCRIT) injection 40,000 Units, CBC    CURRENT THERAPY: Procrit 40,000 units weekly  INTERVAL HISTORY: Devon Griffin 78 y.o. male returns for  regular  visit for followup of anemia of chronic disease.    The patient reports that he is doing well.  He enjoys being at the [facility].  He is undergoing rehabilitation at the center and is tolerating rehab well.   He reports some left lower calf LE discomfort.  He does have unilateral LE Left edema.  This area is not warm.  It is slightly more erythematous.  The patient was informed to tell Dr. Romeo Apple about this LE today as he has an appointment.    I personally reviewed and went over laboratory results with the patient.  Our goal, because the patient is medicare, is to reach a hemoglobin of 11.5 g/dL.  At that time, will hold Procrit and follow for 4-6 weeks thereafter.   Past Medical History  Diagnosis Date  . Anemia   . Hypertension   . Arthritis   . Anemia 06/19/2011    has OA (osteoarthritis) of knee; Anemia; and S/P total knee replacement on his problem list.      has no known allergies.  We administered epoetin alfa.  Past Surgical History  Procedure Date  . Joint replacement     right knee  . Appendectomy   . Hernia repair     bilateral inguinal repair  . Total knee arthroplasty 08/20/2011    Procedure: TOTAL KNEE ARTHROPLASTY;  Surgeon: Vickki Hearing, MD;  Location: [facility];  Service: Orthopedics;  Laterality: Left;  DePuy    Denies any headaches, dizziness, double vision, fevers, chills, night sweats, nausea, vomiting, diarrhea, constipation, chest pain, heart palpitations, shortness of breath, blood in stool, black tarry stool, urinary pain, urinary burning, urinary frequency, hematuria.   PHYSICAL EXAMINATION  ECOG PERFORMANCE STATUS: 2 -  Symptomatic, <50% confined to bed  Filed Vitals:   09/18/11 1009  BP: 133/83  Pulse: 81  Temp: 98 F (36.7 C)    GENERAL:alert, no distress, well nourished, well developed, comfortable, cooperative and smiling SKIN: skin color, texture, turgor are normal, no rashes or significant lesions HEAD: Normocephalic, No masses, lesions, tenderness or abnormalities EYES: normal, EOMI, Conjunctiva are pink and non-injected EARS: External ears normal OROPHARYNX:lips, buccal mucosa, and tongue normal and mucous membranes are moist  NECK: supple, no adenopathy, thyroid normal size, non-tender, without nodularity, no stridor, non-tender, trachea midline LYMPH:  no palpable lymphadenopathy BREAST:not examined LUNGS: clear to auscultation  HEART: regular rate & rhythm, no murmurs, no gallops, S1 normal and S2 normal ABDOMEN:abdomen soft, non-tender and normal bowel sounds BACK: Back symmetric, no curvature., No CVA tenderness EXTREMITIES:less then 2 second capillary refill, no clubbing, no cyanosis, left LE extremity edema, 1-2+ pitting, mild erythema of left LE.  Negative finding on right LE.   NEURO: alert & oriented x 3 with fluent speech, no focal motor/sensory deficits, in wheelchair   LABORATORY DATA: CBC    Component Value Date/Time   WBC 7.7 09/11/2011 1300   RBC 3.01* 09/11/2011 1300   HGB 9.3* 09/11/2011 1300   HCT 30.4* 09/11/2011 1300   PLT 288 09/11/2011 1300   MCV 101.0* 09/11/2011 1300   MCH 30.9 09/11/2011 1300   MCHC 30.6 09/11/2011 1300   RDW 18.6* 09/11/2011 1300   LYMPHSABS 1.1  08/14/2011 1020   MONOABS 0.3 08/14/2011 1020   EOSABS 0.2 08/14/2011 1020   BASOSABS 0.0 08/14/2011 1020      ASSESSMENT:  1. Anemia of chronic disease, and an element of anemia secondary to acute blood loss, S/P total left knee arthroplasty on 08/20/2011 by Dr. Romeo Apple. He is on ferrous sulfate PO. Now on Procrit 40,000 units for hemoglobin support during the healing process of the aforementioned surgical  procedure. Hemoglobin goal of 11.5  g/dL for 4-6 weeks following surgery with Procrit injections. Patient receiving Procrit 40,000 units weekly.    2. Osteoarthritis, S/P left complete knee arthroplasty on 08/20/2011.  3. HTN   PLAN:  1. I personally reviewed and went over laboratory results with the patient. 2. Continue 40,000 units of Procrit weekly. 3. Hgb goal of 11.5 g/dL due to [employer].  When this is attained with hold Procrit and observe for 4-6 weeks.  Then release him from the clinic. 4. Lab work today: CBC 5. Procrit injection 40,000 units today. 6. Patient strongly urged to inform Dr. Romeo Apple about his left LE discomfort today when he is there for his appointment. 7. Return in 4 weeks for follow-up.    All questions were answered. The patient knows to call the clinic with any problems, questions or concerns. We can certainly see the patient much sooner if necessary.  The patient and plan discussed with Glenford Peers, MD and he is in agreement with the aforementioned.  Kato Wieczorek

## 2011-09-18 NOTE — Progress Notes (Signed)
Patient ID: Devon Griffin, male   DOB: 1928/06/22, 78 y.o.   MRN: [id number] Chief Complaint  Patient presents with  . Follow-up    post op 2 left TKA DOS 08/20/11    Doing well postop bead placement had a lot of blisters. Skin has resolved knee flexion, 90 and extension -10  Recommended the therapy should be ready for discharge home with home health and home physical therapy.  Followup 2 weeks

## 2011-09-25 ENCOUNTER — Encounter (HOSPITAL_COMMUNITY): Payer: Medicare Other

## 2011-09-25 DIAGNOSIS — D649 Anemia, unspecified: Secondary | ICD-10-CM

## 2011-09-25 LAB — CBC
MCH: 31.2 pg (ref 26.0–34.0)
MCHC: 30.6 g/dL (ref 30.0–36.0)
MCV: 102.1 fL — ABNORMAL HIGH (ref 78.0–100.0)
Platelets: 216 10*3/uL (ref 150–400)
RDW: 18.3 % — ABNORMAL HIGH (ref 11.5–15.5)

## 2011-09-25 NOTE — Progress Notes (Signed)
No procrit today,Hgb 11.8. Pt to return next week for labs +/- Procrit.

## 2011-09-25 NOTE — Progress Notes (Signed)
Patient hemoglobin reviewed.  Hgb of 11.8 g/dL today.  Will hold procrit today and administer Procrit 40,000 units every 2 weeks (compared to every week).  Kohler Pellerito

## 2011-10-02 ENCOUNTER — Encounter (HOSPITAL_COMMUNITY): Payer: Medicare Other

## 2011-10-02 ENCOUNTER — Other Ambulatory Visit (HOSPITAL_COMMUNITY): Payer: Medicare Other

## 2011-10-03 ENCOUNTER — Ambulatory Visit (INDEPENDENT_AMBULATORY_CARE_PROVIDER_SITE_OTHER): Payer: Medicare Other | Admitting: Orthopedic Surgery

## 2011-10-03 ENCOUNTER — Encounter: Payer: Self-pay | Admitting: Orthopedic Surgery

## 2011-10-03 VITALS — BP 108/58 | Ht 73.0 in | Wt 184.0 lb

## 2011-10-03 DIAGNOSIS — Z96659 Presence of unspecified artificial knee joint: Secondary | ICD-10-CM

## 2011-10-03 NOTE — Patient Instructions (Signed)
No additional instructions no additional instruction

## 2011-10-03 NOTE — Progress Notes (Signed)
Patient ID: Devon Griffin, male   DOB: 03-28-1929, 78 y.o.   MRN: [id number] Chief Complaint  Patient presents with  . Follow-up    2 week recheck left TKA, DOS 08/20/11   BP 108/58  Ht 6\' 1"  (1.854 m)  Wt 184 lb (83.462 kg)  BMI 24.28 kg/m2  Wound check.  The wound has cleaned up nicely. The blisters have all resolved. The patient is ambulating with a walker. His house is being prepared for his return. He will followup in 2 months

## 2011-10-09 ENCOUNTER — Encounter (HOSPITAL_COMMUNITY): Payer: Medicare Other

## 2011-10-09 ENCOUNTER — Encounter (HOSPITAL_BASED_OUTPATIENT_CLINIC_OR_DEPARTMENT_OTHER): Payer: Medicare Other

## 2011-10-09 DIAGNOSIS — D649 Anemia, unspecified: Secondary | ICD-10-CM

## 2011-10-09 LAB — CBC
MCHC: 31 g/dL (ref 30.0–36.0)
RDW: 16.5 % — ABNORMAL HIGH (ref 11.5–15.5)

## 2011-10-09 NOTE — Progress Notes (Signed)
HGB 11.7; HCT37.8.  Procrit inj held today.

## 2011-10-09 NOTE — Progress Notes (Signed)
[facility] notified of patients CBC and return appts. Appt list faxed to [facility].

## 2011-10-16 ENCOUNTER — Ambulatory Visit (HOSPITAL_COMMUNITY): Payer: Medicare Other | Admitting: Oncology

## 2011-10-16 ENCOUNTER — Other Ambulatory Visit (HOSPITAL_COMMUNITY): Payer: Medicare Other

## 2011-10-16 ENCOUNTER — Encounter (HOSPITAL_COMMUNITY): Payer: Medicare Other

## 2011-10-23 ENCOUNTER — Encounter (HOSPITAL_COMMUNITY): Payer: Medicare Other

## 2011-10-23 ENCOUNTER — Other Ambulatory Visit (HOSPITAL_COMMUNITY): Payer: Medicare Other

## 2011-10-25 ENCOUNTER — Ambulatory Visit (HOSPITAL_COMMUNITY): Payer: Medicare Other

## 2011-10-25 ENCOUNTER — Other Ambulatory Visit (HOSPITAL_COMMUNITY): Payer: Medicare Other

## 2011-12-11 ENCOUNTER — Ambulatory Visit: Payer: Medicare Other | Admitting: Orthopedic Surgery

## 2011-12-18 ENCOUNTER — Ambulatory Visit: Payer: Medicare Other | Admitting: Orthopedic Surgery

## 2011-12-20 ENCOUNTER — Ambulatory Visit (INDEPENDENT_AMBULATORY_CARE_PROVIDER_SITE_OTHER): Payer: Medicare Other | Admitting: Orthopedic Surgery

## 2011-12-20 ENCOUNTER — Encounter: Payer: Self-pay | Admitting: Orthopedic Surgery

## 2011-12-20 VITALS — BP 110/70 | Ht 73.0 in | Wt 187.0 lb

## 2011-12-20 DIAGNOSIS — Z96659 Presence of unspecified artificial knee joint: Secondary | ICD-10-CM

## 2011-12-20 NOTE — Progress Notes (Signed)
Patient ID: Devon Griffin, male   DOB: 06-22-1928, 78 y.o.   MRN: [id number] Chief Complaint  Patient presents with  . Follow-up    recheck left knee, DOS 08/20/11    BP 110/70  Ht 6\' 1"  (1.854 m)  Wt 187 lb (84.823 kg)  BMI 24.67 kg/m2  LEFT total knee arthroplasty followup.  Doing well.  Flexion arc is 115 knee is stable. Skin looks clean.  Returned in April for annual followup x-ray

## 2011-12-20 NOTE — Patient Instructions (Signed)
activities as tolerated 

## 2012-08-19 ENCOUNTER — Ambulatory Visit: Payer: Medicare Other | Admitting: Orthopedic Surgery

## 2012-08-19 ENCOUNTER — Telehealth: Payer: Self-pay | Admitting: Orthopedic Surgery

## 2012-08-19 NOTE — Telephone Encounter (Signed)
Per niece and POA, Nechama Guard, patient was fine with this appointment last evening, however, today he refuses to come.  She has been trying to help him to get ready, but states she will need to call back to re-schedule after she works it out with patient and other family member.

## 2012-08-20 ENCOUNTER — Ambulatory Visit: Payer: Medicare Other | Admitting: Orthopedic Surgery

## 2014-12-23 ENCOUNTER — Inpatient Hospital Stay (HOSPITAL_COMMUNITY)
Admission: EM | Admit: 2014-12-23 | Discharge: 2014-12-25 | DRG: 683 | Disposition: A | Discharge status: Home / Self Care | Payer: Medicare Other | Attending: Internal Medicine | Admitting: Internal Medicine

## 2014-12-23 ENCOUNTER — Encounter (HOSPITAL_COMMUNITY): Payer: Self-pay | Admitting: Emergency Medicine

## 2014-12-23 DIAGNOSIS — D649 Anemia, unspecified: Secondary | ICD-10-CM | POA: Diagnosis present

## 2014-12-23 DIAGNOSIS — Z96652 Presence of left artificial knee joint: Secondary | ICD-10-CM | POA: Diagnosis present

## 2014-12-23 DIAGNOSIS — N138 Other obstructive and reflux uropathy: Secondary | ICD-10-CM | POA: Diagnosis present

## 2014-12-23 DIAGNOSIS — M1990 Unspecified osteoarthritis, unspecified site: Secondary | ICD-10-CM | POA: Diagnosis present

## 2014-12-23 DIAGNOSIS — N401 Enlarged prostate with lower urinary tract symptoms: Secondary | ICD-10-CM | POA: Diagnosis present

## 2014-12-23 DIAGNOSIS — N179 Acute kidney failure, unspecified: Secondary | ICD-10-CM | POA: Diagnosis not present

## 2014-12-23 DIAGNOSIS — R339 Retention of urine, unspecified: Secondary | ICD-10-CM

## 2014-12-23 DIAGNOSIS — Z809 Family history of malignant neoplasm, unspecified: Secondary | ICD-10-CM

## 2014-12-23 DIAGNOSIS — R338 Other retention of urine: Secondary | ICD-10-CM | POA: Diagnosis present

## 2014-12-23 DIAGNOSIS — N139 Obstructive and reflux uropathy, unspecified: Secondary | ICD-10-CM

## 2014-12-23 DIAGNOSIS — Z833 Family history of diabetes mellitus: Secondary | ICD-10-CM

## 2014-12-23 DIAGNOSIS — N19 Unspecified kidney failure: Secondary | ICD-10-CM

## 2014-12-23 DIAGNOSIS — I10 Essential (primary) hypertension: Secondary | ICD-10-CM | POA: Diagnosis present

## 2014-12-23 HISTORY — DX: Disorder of kidney and ureter, unspecified: N28.9

## 2014-12-23 HISTORY — DX: Atrial premature depolarization: I49.1

## 2014-12-23 HISTORY — DX: Elevated prostate specific antigen (PSA): R97.20

## 2014-12-23 LAB — CBC WITH DIFFERENTIAL/PLATELET
Basophils Absolute: 0 K/uL (ref 0.0–0.1)
Basophils Relative: 0 % (ref 0–1)
Eosinophils Absolute: 0.1 K/uL (ref 0.0–0.7)
Eosinophils Relative: 1 % (ref 0–5)
HCT: 27.4 % — ABNORMAL LOW (ref 39.0–52.0)
Hemoglobin: 8.8 g/dL — ABNORMAL LOW (ref 13.0–17.0)
Lymphocytes Relative: 16 % (ref 12–46)
Lymphs Abs: 1 K/uL (ref 0.7–4.0)
MCH: 31.2 pg (ref 26.0–34.0)
MCHC: 32.1 g/dL (ref 30.0–36.0)
MCV: 97.2 fL (ref 78.0–100.0)
Monocytes Absolute: 1 K/uL (ref 0.1–1.0)
Monocytes Relative: 17 % — ABNORMAL HIGH (ref 3–12)
Neutro Abs: 4 K/uL (ref 1.7–7.7)
Neutrophils Relative %: 66 % (ref 43–77)
Platelets: 122 K/uL — ABNORMAL LOW (ref 150–400)
RBC: 2.82 MIL/uL — ABNORMAL LOW (ref 4.22–5.81)
RDW: 14.1 % (ref 11.5–15.5)
WBC: 6.1 K/uL (ref 4.0–10.5)

## 2014-12-23 LAB — URINE MICROSCOPIC-ADD ON

## 2014-12-23 LAB — COMPREHENSIVE METABOLIC PANEL WITH GFR
ALT: 11 U/L — ABNORMAL LOW (ref 17–63)
AST: 11 U/L — ABNORMAL LOW (ref 15–41)
Albumin: 3.9 g/dL (ref 3.5–5.0)
Alkaline Phosphatase: 44 U/L (ref 38–126)
Anion gap: 12 (ref 5–15)
BUN: 67 mg/dL — ABNORMAL HIGH (ref 6–20)
CO2: 19 mmol/L — ABNORMAL LOW (ref 22–32)
Calcium: 8.7 mg/dL — ABNORMAL LOW (ref 8.9–10.3)
Chloride: 107 mmol/L (ref 101–111)
Creatinine, Ser: 7.39 mg/dL — ABNORMAL HIGH (ref 0.61–1.24)
GFR calc Af Amer: 7 mL/min — ABNORMAL LOW
GFR calc non Af Amer: 6 mL/min — ABNORMAL LOW
Glucose, Bld: 103 mg/dL — ABNORMAL HIGH (ref 65–99)
Potassium: 4.1 mmol/L (ref 3.5–5.1)
Sodium: 138 mmol/L (ref 135–145)
Total Bilirubin: 0.7 mg/dL (ref 0.3–1.2)
Total Protein: 6.6 g/dL (ref 6.5–8.1)

## 2014-12-23 LAB — URINALYSIS, ROUTINE W REFLEX MICROSCOPIC
Bilirubin Urine: NEGATIVE
Glucose, UA: NEGATIVE mg/dL
Ketones, ur: NEGATIVE mg/dL
Leukocytes, UA: NEGATIVE
Nitrite: NEGATIVE
Protein, ur: NEGATIVE mg/dL
Specific Gravity, Urine: 1.015 (ref 1.005–1.030)
Urobilinogen, UA: 0.2 mg/dL (ref 0.0–1.0)
pH: 5.5 (ref 5.0–8.0)

## 2014-12-23 NOTE — ED Provider Notes (Signed)
CSN: [id number]     Arrival date & time 12/23/14  2029 History  This chart was scribed for Devon Ferguson, MD by Hansel Feinstein, ED Scribe. This patient was seen in room APA06/AP[facility]06 and the patient's care was started at 9:01 PM.     Chief Complaint  Patient presents with  . Urinary Retention   Patient is a 81 y.o. male presenting with abdominal pain. The history is provided by the patient and a relative (pt c/o urinary retention). No language interpreter was used.  Abdominal Pain Pain location:  Suprapubic Pain quality: fullness   Pain radiates to:  Does not radiate Pain severity:  Mild Onset quality:  Gradual Duration:  1 day Timing:  Constant Progression:  Unchanged Chronicity:  New Relieved by:  None tried Worsened by:  Nothing tried Ineffective treatments:  Urination Associated symptoms: no chest pain, no cough, no diarrhea, no dysuria, no fatigue and no hematuria     HPI Comments: Devon Griffin is a 81 y.o. male who presents to the Emergency Department complaining of moderate urinary retention onset today. He states associated decreased urine, mild abdominal pain, abdominal bloating, leg swelling. He states his last urination was PTA and it was a very small amount. He denies Hx of similar symptoms. He also denies dysuria.   Past Medical History  Diagnosis Date  . Anemia   . Hypertension   . Arthritis   . Anemia 06/19/2011  . Renal insufficiency   . Elevated prostate specific antigen (PSA)   . Premature atrial contractions    Past Surgical History  Procedure Laterality Date  . Joint replacement      right knee  . Appendectomy    . Hernia repair      bilateral inguinal repair  . Total knee arthroplasty  08/20/2011    Procedure: TOTAL KNEE ARTHROPLASTY;  Surgeon: Carole Civil, MD;  Location: [facility];  Service: Orthopedics;  Laterality: Left;  DePuy   Family History  Problem Relation Age of Onset  . Arthritis    . Diabetes Mother   . Cancer Father    Social History   Substance Use Topics  . Smoking status: Never Smoker   . Smokeless tobacco: Never Used  . Alcohol Use: No    Review of Systems  Constitutional: Negative for appetite change and fatigue.  HENT: Negative for congestion, ear discharge and sinus pressure.   Eyes: Negative for discharge.  Respiratory: Negative for cough.   Cardiovascular: Positive for leg swelling. Negative for chest pain.  Gastrointestinal: Positive for abdominal pain and abdominal distention. Negative for diarrhea.  Genitourinary: Positive for decreased urine volume. Negative for dysuria, frequency and hematuria.  Musculoskeletal: Negative for back pain.  Skin: Negative for rash.  Neurological: Negative for seizures and headaches.  Psychiatric/Behavioral: Negative for hallucinations.    Allergies  Review of patient's allergies indicates no known allergies.  Home Medications   Prior to Admission medications   Medication Sig Start Date End Date Taking? Authorizing Provider  Alum & Mag Hydroxide-Simeth (MAGIC MOUTHWASH) SOLN Take 5 mLs by mouth 3 (three) times daily before meals.    Historical Provider, MD  aspirin 325 MG EC tablet Take 325 mg by mouth 2 (two) times daily. 08/23/11   Carole Civil, MD  docusate sodium (COLACE) 100 MG capsule Take 100 mg by mouth 2 (two) times daily.      Historical Provider, MD  epoetin alfa (EPOGEN,PROCRIT) 34742 UNIT/ML injection Inject 1 mL (40,000 Units total) into the skin  once a week. 08/23/11   Carole Civil, MD  ferrous sulfate 325 (65 FE) MG tablet Take 325 mg by mouth 2 (two) times daily.      Historical Provider, MD  HYDROcodone-acetaminophen (NORCO) 5-325 MG per tablet Take 1 tablet by mouth every 6 (six) hours as needed. Pain 09/17/11   Carole Civil, MD  lisinopril (PRINIVIL,ZESTRIL) 10 MG tablet Take 10 mg by mouth daily.    Historical Provider, MD  potassium chloride SA (K-DUR,KLOR-CON) 20 MEQ tablet Take 20 mEq by mouth daily.    Historical Provider, MD   traMADol (ULTRAM) 50 MG tablet Take 50 mg by mouth every 6 (six) hours. 08/23/11   Carole Civil, MD   BP 125/63 mmHg  Pulse 83  Temp(Src) 98.7 F (37.1 C) (Oral)  Resp 20  Ht 6\' 1"  (1.854 m)  Wt 199 lb (90.266 kg)  BMI 26.26 kg/m2  SpO2 99% Physical Exam  Constitutional: He is oriented to person, place, and time. He appears well-developed.  HENT:  Head: Normocephalic.  Eyes: Conjunctivae and EOM are normal. No scleral icterus.  Neck: Neck supple. No thyromegaly present.  Cardiovascular: Normal rate and regular rhythm.  Exam reveals no gallop and no friction rub.   No murmur heard. Pulmonary/Chest: No stridor. He has no wheezes. He has no rales. He exhibits no tenderness.  Abdominal: He exhibits distension. There is tenderness. There is no rebound.  Mild suprapubic tenderness.   Musculoskeletal: Normal range of motion. He exhibits edema.  3+ edema of both ankles.  Lymphadenopathy:    He has no cervical adenopathy.  Neurological: He is oriented to person, place, and time. He exhibits normal muscle tone. Coordination normal.  Skin: No rash noted. No erythema.  Psychiatric: He has a normal mood and affect. His behavior is normal.  Nursing note and vitals reviewed.  ED Course  Procedures (including critical care time) DIAGNOSTIC STUDIES: Oxygen Saturation is 99% on RA, normal by my interpretation.    COORDINATION OF CARE: 9:07 PM Discussed treatment plan with pt at bedside and pt agreed to plan.   Labs Review Labs Reviewed  URINALYSIS, ROUTINE W REFLEX MICROSCOPIC (NOT AT [facility])  CBC WITH DIFFERENTIAL/PLATELET  COMPREHENSIVE METABOLIC PANEL    Imaging Review No results found. I, Hansel Feinstein, personally reviewed and evaluated these images and lab results as part of my medical decision-making.   EKG Interpretation None     Pt put out 1600 cc with foley MDM   Final diagnoses:  None    Urinary retention,  Renal failure.   Anemia,   Will admit to medicine with  urology consult,      Devon Ferguson, MD 12/24/14 [phone]

## 2014-12-23 NOTE — ED Notes (Signed)
Patient reports urinary retention today. States has attempted to urinate multiple times today, but has had only a very small amount of urinary output.

## 2014-12-24 ENCOUNTER — Encounter (HOSPITAL_COMMUNITY): Payer: Self-pay | Admitting: *Deleted

## 2014-12-24 ENCOUNTER — Inpatient Hospital Stay (HOSPITAL_COMMUNITY): Payer: Medicare Other

## 2014-12-24 DIAGNOSIS — Z833 Family history of diabetes mellitus: Secondary | ICD-10-CM | POA: Diagnosis not present

## 2014-12-24 DIAGNOSIS — N139 Obstructive and reflux uropathy, unspecified: Secondary | ICD-10-CM | POA: Diagnosis present

## 2014-12-24 DIAGNOSIS — D538 Other specified nutritional anemias: Secondary | ICD-10-CM | POA: Diagnosis not present

## 2014-12-24 DIAGNOSIS — R338 Other retention of urine: Secondary | ICD-10-CM | POA: Diagnosis present

## 2014-12-24 DIAGNOSIS — R944 Abnormal results of kidney function studies: Secondary | ICD-10-CM

## 2014-12-24 DIAGNOSIS — Z96652 Presence of left artificial knee joint: Secondary | ICD-10-CM | POA: Diagnosis present

## 2014-12-24 DIAGNOSIS — N179 Acute kidney failure, unspecified: Secondary | ICD-10-CM | POA: Diagnosis present

## 2014-12-24 DIAGNOSIS — N19 Unspecified kidney failure: Secondary | ICD-10-CM | POA: Insufficient documentation

## 2014-12-24 DIAGNOSIS — I10 Essential (primary) hypertension: Secondary | ICD-10-CM

## 2014-12-24 DIAGNOSIS — N401 Enlarged prostate with lower urinary tract symptoms: Secondary | ICD-10-CM

## 2014-12-24 DIAGNOSIS — R339 Retention of urine, unspecified: Secondary | ICD-10-CM | POA: Diagnosis present

## 2014-12-24 DIAGNOSIS — Z809 Family history of malignant neoplasm, unspecified: Secondary | ICD-10-CM | POA: Diagnosis not present

## 2014-12-24 DIAGNOSIS — N138 Other obstructive and reflux uropathy: Secondary | ICD-10-CM | POA: Diagnosis present

## 2014-12-24 DIAGNOSIS — M1990 Unspecified osteoarthritis, unspecified site: Secondary | ICD-10-CM | POA: Diagnosis present

## 2014-12-24 DIAGNOSIS — D649 Anemia, unspecified: Secondary | ICD-10-CM | POA: Diagnosis present

## 2014-12-24 DIAGNOSIS — R942 Abnormal results of pulmonary function studies: Secondary | ICD-10-CM | POA: Diagnosis not present

## 2014-12-24 LAB — CBC
HCT: 26.4 % — ABNORMAL LOW (ref 39.0–52.0)
HEMOGLOBIN: 8.3 g/dL — AB (ref 13.0–17.0)
MCH: 31 pg (ref 26.0–34.0)
MCHC: 31.4 g/dL (ref 30.0–36.0)
MCV: 98.5 fL (ref 78.0–100.0)
Platelets: 115 10*3/uL — ABNORMAL LOW (ref 150–400)
RBC: 2.68 MIL/uL — ABNORMAL LOW (ref 4.22–5.81)
RDW: 14.1 % (ref 11.5–15.5)
WBC: 4.4 10*3/uL (ref 4.0–10.5)

## 2014-12-24 LAB — IRON AND TIBC
Iron: 22 ug/dL — ABNORMAL LOW (ref 45–182)
SATURATION RATIOS: 11 % — AB (ref 17.9–39.5)
TIBC: 207 ug/dL — AB (ref 250–450)
UIBC: 185 ug/dL

## 2014-12-24 LAB — BASIC METABOLIC PANEL
Anion gap: 8 (ref 5–15)
BUN: 60 mg/dL — AB (ref 6–20)
CALCIUM: 8.7 mg/dL — AB (ref 8.9–10.3)
CO2: 22 mmol/L (ref 22–32)
Chloride: 113 mmol/L — ABNORMAL HIGH (ref 101–111)
Creatinine, Ser: 5.73 mg/dL — ABNORMAL HIGH (ref 0.61–1.24)
GFR, EST AFRICAN AMERICAN: 9 mL/min — AB (ref >60)
GFR, EST NON AFRICAN AMERICAN: 8 mL/min — AB (ref >60)
GLUCOSE: 89 mg/dL (ref 65–99)
POTASSIUM: 4.2 mmol/L (ref 3.5–5.1)
SODIUM: 143 mmol/L (ref 135–145)

## 2014-12-24 LAB — RETICULOCYTES
RBC.: 2.79 MIL/uL — ABNORMAL LOW (ref 4.22–5.81)
RETIC CT PCT: 0.6 % (ref 0.4–3.1)
Retic Count, Absolute: 16.7 10*3/uL — ABNORMAL LOW (ref 19.0–186.0)

## 2014-12-24 LAB — FOLATE: Folate: 38.6 ng/mL (ref >5.9)

## 2014-12-24 LAB — FERRITIN: Ferritin: 147 ng/mL (ref 24–336)

## 2014-12-24 LAB — VITAMIN B12: Vitamin B-12: 467 pg/mL (ref 180–914)

## 2014-12-24 MED ORDER — TAMSULOSIN HCL 0.4 MG PO CAPS
0.4000 mg | ORAL_CAPSULE | Freq: Every day | ORAL | Status: DC
  Administered 2014-12-24: 0.4 mg via ORAL
  Filled 2014-12-24: qty 1

## 2014-12-24 MED ORDER — SODIUM CHLORIDE 0.9 % IJ SOLN
3.0000 mL | Freq: Two times a day (BID) | INTRAMUSCULAR | Status: DC
  Administered 2014-12-24 (×3): 3 mL via INTRAVENOUS

## 2014-12-24 MED ORDER — SODIUM CHLORIDE 0.9 % IV SOLN: 250.0000 mL | INTRAVENOUS | Status: DC | PRN

## 2014-12-24 MED ORDER — SODIUM CHLORIDE 0.9 % IJ SOLN: 3.0000 mL | INTRAMUSCULAR | Status: DC | PRN

## 2014-12-24 MED ORDER — SODIUM CHLORIDE 0.9 % IV BOLUS (SEPSIS)
1000.0000 mL | Freq: Once | INTRAVENOUS | Status: AC
  Administered 2014-12-24: 1000 mL via INTRAVENOUS

## 2014-12-24 NOTE — Assessment & Plan Note (Addendum)
He has BPH with retention and a PVR of 1673ml and ARI that is improving post foley.    He is documented to have an elevated PSA of about 13 but his exam is benign.     I would continue the tamsulosin. He will need to have the foley for a couple of weeks and then f/u in my office for cystoscopy and a voiding trial. I will order a PSA, but it is likely to be falsely elevated in the face of acute retention.  He may need a prostate biopsy as well as the cystoscopy.   Contact me at [phone] if there are questions.

## 2014-12-24 NOTE — H&P (Signed)
PCP:   Placido Sou, MD   Chief Complaint:  Cannot urinate  HPI: 81 yo male independent comes in with over 48 hours of unable to urinate.  Denies any abdominal pain, but says i was a little uncomfortable but feel better now.  No fevers.  No n/v/d.  Foley placed on arrival to ED and over 1500cc urine resulted in bag.  Pt says he has not had any sob.  No new medications except castor oil he took days ago for constipation (which did the trick).  He has chronic swelling to his legs which he reports is not new.  His cr is over 7, referred for admission for his AKI and urinary obstruction.  Review of Systems:  Positive and negative as per HPI otherwise all other systems are negative  Past Medical History: Past Medical History  Diagnosis Date  . Anemia   . Hypertension   . Arthritis   . Anemia 06/19/2011  . Renal insufficiency   . Elevated prostate specific antigen (PSA)   . Premature atrial contractions    Past Surgical History  Procedure Laterality Date  . Joint replacement      right knee  . Appendectomy    . Hernia repair      bilateral inguinal repair  . Total knee arthroplasty  08/20/2011    Procedure: TOTAL KNEE ARTHROPLASTY;  Surgeon: Carole Civil, MD;  Location: [facility];  Service: Orthopedics;  Laterality: Left;  DePuy    Medications: Prior to Admission medications   Medication Sig Start Date End Date Taking? Authorizing Provider  docusate sodium (COLACE) 100 MG capsule Take 100 mg by mouth 2 (two) times daily.     Yes Historical Provider, MD  ferrous sulfate 325 (65 FE) MG tablet Take 325 mg by mouth daily.    Yes Historical Provider, MD  lisinopril-hydrochlorothiazide (PRINZIDE,ZESTORETIC) 20-25 MG per tablet Take 1 tablet by mouth daily.  12/16/14  Yes Historical Provider, MD    Allergies:  No Known Allergies  Social History:  reports that he has never smoked. He has never used smokeless tobacco. He reports that he does not drink alcohol or use illicit  drugs.  Family History: Family History  Problem Relation Age of Onset  . Arthritis    . Diabetes Mother   . Cancer Father     Physical Exam: Filed Vitals:   12/23/14 2043 12/23/14 2225 12/23/14 2358  BP: 125/63 116/58 134/70  Pulse: 83 67 75  Temp: 98.7 F (37.1 C) 98 F (36.7 C)   TempSrc: Oral Oral   Resp: 20 20 20   Height: 6\' 1"  (1.854 m)    Weight: 90.266 kg (199 lb)    SpO2: 99% 96% 97%   General appearance: alert, cooperative and no distress Head: Normocephalic, without obvious abnormality, atraumatic Eyes: negative Nose: Nares normal. Septum midline. Mucosa normal. No drainage or sinus tenderness. Neck: no JVD and supple, symmetrical, trachea midline Lungs: clear to auscultation bilaterally Heart: regular rate and rhythm, S1, S2 normal, no murmur, click, rub or gallop Abdomen: soft, non-tender; bowel sounds normal; no masses,  no organomegaly Extremities: edema 2-3 plus pitting edema Pulses: 2+ and symmetric Skin: Skin color, texture, turgor normal. No rashes or lesions Neurologic: Grossly normal    Labs on Admission:   Recent Labs  12/23/14 2122  NA 138  K 4.1  CL 107  CO2 19*  GLUCOSE 103*  BUN 67*  CREATININE 7.39*  CALCIUM 8.7*    Recent Labs  12/23/14 2122  AST 11*  ALT 11*  ALKPHOS 44  BILITOT 0.7  PROT 6.6  ALBUMIN 3.9    Recent Labs  12/23/14 2122  WBC 6.1  NEUTROABS 4.0  HGB 8.8*  HCT 27.4*  MCV 97.2  PLT 122*    Recent Labs  12/23/14 2122  RETICCTPCT 0.6    Radiological Exams on Admission: No results found.  Assessment/Plan  81 yo male with AKI secondary to urinary obstruction now relieved after foley catheter placement  Principal Problem:   AKI (acute kidney injury)-  Given a liter bolus in ED, uop over 2 liters so far.  ua no infection.  Obtain renal ultrasound in am, likely related to prostate.  Urology called and will see in am.  Repeat cr in am, should improve now obstruction is relieved.  Active  Problems:   Anemia-  No overt bleeding, takes iron.  Cont outpatient follow up   Hypertension-  noted   Urinary obstruction-  As above  Admit to medical bed.  Await urology recommendations.  Devon Griffin A 12/24/2014, 12:24 AM

## 2014-12-24 NOTE — ED Notes (Signed)
Repaged to Dr Karsten Ro . Error in paging Dr Ottis Stain. Dr Roderic Palau made aware

## 2014-12-24 NOTE — Discharge Instructions (Signed)
Foley Catheter Care °A Foley catheter is a soft, flexible tube that is placed into the bladder to drain urine. A Foley catheter may be inserted if: °· You leak urine or are not able to control when you urinate (urinary incontinence). °· You are not able to urinate when you need to (urinary retention). °· You had prostate surgery or surgery on the genitals. °· You have certain medical conditions, such as multiple sclerosis, dementia, or a spinal cord injury. °If you are going home with a Foley catheter in place, follow the instructions below. °TAKING CARE OF THE CATHETER °1. Wash your hands with soap and water. °2. Using mild soap and warm water on a clean washcloth: °· Clean the area on your body closest to the catheter insertion site using a circular motion, moving away from the catheter. Never wipe toward the catheter because this could sweep bacteria up into the urethra and cause infection. °· Remove all traces of soap. Pat the area dry with a clean towel. For males, reposition the foreskin. °3. Attach the catheter to your leg so there is no tension on the catheter. Use adhesive tape or a leg strap. If you are using adhesive tape, remove any sticky residue left behind by the previous tape you used. °4. Keep the drainage bag below the level of the bladder, but keep it off the floor. °5. Check throughout the day to be sure the catheter is working and urine is draining freely. Make sure the tubing does not become kinked. °6. Do not pull on the catheter or try to remove it. Pulling could damage internal tissues. °TAKING CARE OF THE DRAINAGE BAGS °You will be given two drainage bags to take home. One is a large overnight drainage bag, and the other is a smaller leg bag that fits underneath clothing. You may wear the overnight bag at any time, but you should never wear the smaller leg bag at night. Follow the instructions below for how to empty, change, and clean your drainage bags. °Emptying the Drainage Bag °You must  empty your drainage bag when it is  -½ full or at least 2-3 times a day. °1. Wash your hands with soap and water. °2. Keep the drainage bag below your hips, below the level of your bladder. This stops urine from going back into the tubing and into your bladder. °3. Hold the dirty bag over the toilet or a clean container. °4. Open the pour spout at the bottom of the bag and empty the urine into the toilet or container. Do not let the pour spout touch the toilet, container, or any other surface. Doing so can place bacteria on the bag, which can cause an infection. °5. Clean the pour spout with a gauze pad or cotton ball that has rubbing alcohol on it. °6. Close the pour spout. °7. Attach the bag to your leg with adhesive tape or a leg strap. °8. Wash your hands well. °Changing the Drainage Bag °Change your drainage bag once a month or sooner if it starts to smell bad or look dirty. Below are steps to follow when changing the drainage bag. °1. Wash your hands with soap and water. °2. Pinch off the rubber catheter so that urine does not spill out. °3. Disconnect the catheter tube from the drainage tube at the connection valve. Do not let the tubes touch any surface. °4. Clean the end of the catheter tube with an alcohol wipe. Use a different alcohol wipe to clean the   end of the drainage tube. °5. Connect the catheter tube to the drainage tube of the clean drainage bag. °6. Attach the new bag to the leg with adhesive tape or a leg strap. Avoid attaching the new bag too tightly. °7. Wash your hands well. °Cleaning the Drainage Bag °1. Wash your hands with soap and water. °2. Wash the bag in warm, soapy water. °3. Rinse the bag thoroughly with warm water. °4. Fill the bag with a solution of white vinegar and water (1 cup vinegar to 1 qt warm water [.2 L vinegar to 1 L warm water]). Close the bag and soak it for 30 minutes in the solution. °5. Rinse the bag with warm water. °6. Hang the bag to dry with the pour spout open  and hanging downward. °7. Store the clean bag (once it is dry) in a clean plastic bag. °8. Wash your hands well. °PREVENTING INFECTION °· Wash your hands before and after handling your catheter. °· Take showers daily and wash the area where the catheter enters your body. Do not take baths. Replace wet leg straps with dry ones, if this applies. °· Do not use powders, sprays, or lotions on the genital area. Only use creams, lotions, or ointments as directed by your caregiver. °· For females, wipe from front to back after each bowel movement. °· Drink enough fluids to keep your urine clear or pale yellow unless you have a fluid restriction. °· Do not let the drainage bag or tubing touch or lie on the floor. °· Wear cotton underwear to absorb moisture and to keep your skin drier. °SEEK MEDICAL CARE IF:  °· Your urine is cloudy or smells unusually bad. °· Your catheter becomes clogged. °· You are not draining urine into the bag or your bladder feels full. °· Your catheter starts to leak. °SEEK IMMEDIATE MEDICAL CARE IF:  °· You have pain, swelling, redness, or pus where the catheter enters the body. °· You have pain in the abdomen, legs, lower back, or bladder. °· You have a fever. °· You see blood fill the catheter, or your urine is pink or red. °· You have nausea, vomiting, or chills. °· Your catheter gets pulled out. °MAKE SURE YOU:  °· Understand these instructions. °· Will watch your condition. °· Will get help right away if you are not doing well or get worse. °Document Released: 04/30/2005 Document Revised: 09/14/2013 Document Reviewed: 04/21/2012 °[employer]® Patient Information ©2015 [employer]. This information is not intended to replace advice given to you by your health care provider. Make sure you discuss any questions you have with your health care provider. ° °

## 2014-12-24 NOTE — Progress Notes (Signed)
[facility] PROGRESS NOTE  Devon Griffin ZOX:[id number] DOB: 09-26-1928 DOA: 12/23/2014 PCP: Placido Sou, MD  Assessment/Plan: ARF -Due to urinary obstruction.  -BUN 60 Creatinine 5.73, both improving.  -Renal US pending.  Urinary obstruction  -Resolved after placement of foley. -Started on Flomax. -Urology consult to see today. -Will likely DC with foley and GU follow up.  Anemia  -Chronic  -Check anemia panel. -Hb 8.3 today, no indications for transfusion.   Hypertension -Well controlled.  -Lisinopril/HCTZ on hold given ARF.   Code Status: Full DVT prophylaxis:SCDs Family Communication: Discussed with patient who understands and has no concerns at this time. Disposition Plan:    Consultants:  Urology  Procedures:  None  Antibiotics:  None  HPI/Subjective: Has been unable to urinate for the past two days. Has had relief through the foley catheter that was placed during admission. No reports of chest pain, shortness of breath, n/v/d, or abd pain.  Objective: Filed Vitals:   12/24/14 0519  BP: 114/58  Pulse: 102  Temp: 98 F (36.7 C)  Resp: 18    Intake/Output Summary (Last 24 hours) at 12/24/14 0635 Last data filed at 12/24/14 0523  Gross per 24 hour  Intake      3 ml  Output   2500 ml  Net  -2497 ml   Filed Weights   12/23/14 2043 12/24/14 0142 12/24/14 0519  Weight: 90.266 kg (199 lb) 87.907 kg (193 lb 12.8 oz) 87.091 kg (192 lb)    Exam: General:  NAD, appears calm and comfortable, lying in bed, afebrile, VSS Cardiovascular: RRR, no murmurs, rubs, or gallops. Respiratory: CTAB, no w/r/r Abdomen: soft, bowel sounds present, no distension Extremities: 1-2+ BLE edema Neurologic:  Non focal  Data Reviewed: Basic Metabolic Panel:  Recent Labs Lab 12/23/14 2122  NA 138  K 4.1  CL 107  CO2 19*  GLUCOSE 103*  BUN 67*  CREATININE 7.39*  CALCIUM 8.7*   Liver Function Tests:  Recent Labs Lab 12/23/14 2122  AST 11*  ALT  11*  ALKPHOS 44  BILITOT 0.7  PROT 6.6  ALBUMIN 3.9   No results for input(s): LIPASE, AMYLASE in the last 168 hours. No results for input(s): AMMONIA in the last 168 hours. CBC:  Recent Labs Lab 12/23/14 2122  WBC 6.1  NEUTROABS 4.0  HGB 8.8*  HCT 27.4*  MCV 97.2  PLT 122*   Cardiac Enzymes: No results for input(s): CKTOTAL, CKMB, CKMBINDEX, TROPONINI in the last 168 hours. BNP (last 3 results) No results for input(s): BNP in the last 8760 hours.  ProBNP (last 3 results) No results for input(s): PROBNP in the last 8760 hours.  CBG: No results for input(s): GLUCAP in the last 168 hours.  No results found for this or any previous visit (from the past 240 hour(s)).   Studies: No results found.  Scheduled Meds: . sodium chloride  3 mL Intravenous Q12H   Continuous Infusions:   Principal Problem:   AKI (acute kidney injury) Active Problems:   Anemia   Hypertension   Urinary obstruction   Essential hypertension    Time spent: 30 minutes. Greater than 50% of this time was spent in direct contact with the patient coordinating care.     Domingo Mend, MD   [facility] Pager [phone]. If 7PM-7AM, please contact night-coverage at [email or web address], password [facility] 12/24/2014, 6:35 AM  LOS: 0 days    I, Jessica D. Leonie Green, acting as scribe, recorded this note contemporaneously in the presence of Dr.  Lelon Frohlich, M.D. on 12/24/2014.   I have reviewed the above documentation for accuracy and completeness, and I agree with the above.  Domingo Mend, MD [facility] Pager: [phone]

## 2014-12-24 NOTE — Consult Note (Addendum)
Subjective: Devon Griffin is an 80 yo male who I was asked to see in consultation by Dr. Isaac Bliss for urinary retention.   He presented to the hospital on 8/11 with a 2 day history of urinary retention.  He started to have a slower stream over the last couple of weeks.  He has increased frequency and nocturia.   He had mild dysuria but no hematuria. He has had no prior GU history or treatment for voiding difficulty.   He has the diagnosis of elevated PSA in his history and in [place] he has notes from Dr. Beryl Balz Giovanni and had a PSA of 12. 6 in 2014 and 8.7 in 2012.  It was decided to not do a biopsy because of his age and benign exam.   His family thinks the PSA was about 13 earlier this year.   He was found to be in ARI with a Cr of 7.39 on admission.  A foley was placed with a PVR of 1636ml and his Cr has declined to 5.73.   He has been placed on tamsulosin.   ROS:  Review of Systems  Constitutional: Negative for fever.  HENT: Negative.   Respiratory: Negative.   Cardiovascular: Positive for leg swelling.  Gastrointestinal: Negative.   Genitourinary: Positive for dysuria.       See HPI  Musculoskeletal: Positive for joint pain.  Skin: Negative.   Neurological: Negative.   Endo/Heme/Allergies: Negative.   Psychiatric/Behavioral: Negative.   All other systems reviewed and are negative.   No Known Allergies  Past Medical History  Diagnosis Date  . Anemia   . Hypertension   . Arthritis   . Anemia 06/19/2011  . Renal insufficiency   . Elevated prostate specific antigen (PSA)   . Premature atrial contractions     Past Surgical History  Procedure Laterality Date  . Joint replacement      right knee  . Appendectomy    . Hernia repair      bilateral inguinal repair  . Total knee arthroplasty  08/20/2011    Procedure: TOTAL KNEE ARTHROPLASTY;  Surgeon: Carole Civil, MD;  Location: [facility];  Service: Orthopedics;  Laterality: Left;  [place]    Social History    Social History  . Marital Status: Married    Spouse Name: N/A  . Number of Children: N/A  . Years of Education: N/A   Occupational History  . Not on file.   Social History Main Topics  . Smoking status: Never Smoker   . Smokeless tobacco: Never Used  . Alcohol Use: No  . Drug Use: No  . Sexual Activity: Not on file   Other Topics Concern  . Not on file   Social History Narrative    Family History  Problem Relation Age of Onset  . Arthritis    . Diabetes Mother   . Cancer Father     Anti-infectives: Anti-infectives    None      Current Facility-Administered Medications  Medication Dose Route Frequency Provider Last Rate Last Dose  . 0.9 %  sodium chloride infusion  250 mL Intravenous PRN Phillips Grout, MD      . sodium chloride 0.9 % injection 3 mL  3 mL Intravenous Q12H Phillips Grout, MD   3 mL at 12/24/14 1242  . sodium chloride 0.9 % injection 3 mL  3 mL Intravenous PRN Phillips Grout, MD      . tamsulosin (FLOMAX) capsule 0.4 mg  0.4 mg Oral QPC supper Erline Hau, MD         Objective: Vital signs in last 24 hours: Temp:  [97.9 F (36.6 C)-98.7 F (37.1 C)] 98 F (36.7 C) (08/12 0519) Pulse Rate:  [67-102] 102 (08/12 0519) Resp:  [18-20] 18 (08/12 0519) BP: (114-136)/(58-70) 114/58 mmHg (08/12 0519) SpO2:  [96 %-99 %] 98 % (08/12 0519) Weight:  [87.091 kg (192 lb)-90.266 kg (199 lb)] 87.091 kg (192 lb) (08/12 0519)  Intake/Output from previous day: 08/11 0701 - 08/12 0700 In: 3 [I.V.:3] Out: 2500 [Urine:2500] Intake/Output this shift:     Physical Exam  Constitutional: He is oriented to person, place, and time and well-developed, well-nourished, and in no distress. No distress.  HENT:  Head: Normocephalic and atraumatic.  He is HOH  Neck: Normal range of motion. Neck supple. No thyromegaly present.  Cardiovascular: Normal rate, regular rhythm and normal heart sounds.   Pulmonary/Chest: Effort normal and breath sounds  normal. No respiratory distress.  Abdominal: Soft. Bowel sounds are normal. He exhibits no distension and no mass. There is no tenderness. Hernia confirmed negative in the right inguinal area and confirmed negative in the left inguinal area.  Genitourinary: Rectum normal, testes/scrotum normal and penis normal. Rectal exam shows no mass. Prostate is enlarged (2+ benign in consistency.  SV's non-palpable).  Uncircumcised with foley indwelling.  Lymphadenopathy:    He has no cervical adenopathy.       Right: No inguinal and no supraclavicular adenopathy present.       Left: No inguinal and no supraclavicular adenopathy present.  Neurological: He is alert and oriented to person, place, and time.  He has some lower extremity weakness that he attributes to his knees.  Anal tone and perineal sensation normal.  Skin: Skin is warm and dry.  Psychiatric: Mood and affect normal.    Lab Results:   Recent Labs  12/23/14 2122 12/24/14 0601  WBC 6.1 4.4  HGB 8.8* 8.3*  HCT 27.4* 26.4*  PLT 122* 115*   BMET  Recent Labs  12/23/14 2122 12/24/14 0601  NA 138 143  K 4.1 4.2  CL 107 113*  CO2 19* 22  GLUCOSE 103* 89  BUN 67* 60*  CREATININE 7.39* 5.73*  CALCIUM 8.7* 8.7*   PT/INR No results for input(s): LABPROT, INR in the last 72 hours. ABG No results for input(s): PHART, HCO3 in the last 72 hours.  Invalid input(s): PCO2, PO2  Studies/Results: US Renal  12/24/2014   CLINICAL DATA:  Acute kidney injury  EXAM: RENAL / URINARY TRACT ULTRASOUND COMPLETE  COMPARISON:  None.  FINDINGS: Right Kidney:  Length: 7.4 cm. Echogenicity within normal limits. No mass or hydronephrosis visualized.  Left Kidney:  Length: 14 cm. Echogenicity within normal limits. 2.4 x 2.3 x 3 cm anechoic left upper pole renal mass with increased through transmission most consistent with a cyst. No hydronephrosis visualized.  Bladder:  Foley catheter within the bladder. There is severe bladder wall thickening versus  complex material within the bladder surrounding the Foley catheter.  IMPRESSION: 1. Atrophic right kidney. 2. No obstructive uropathy. 3. There is severe bladder wall thickening versus complex material within the bladder surrounding the Foley catheter. Correlate with clinical history.   Electronically Signed   By: Kathreen Devoid   On: 12/24/2014 09:24   Lab and UA results reviewed.   His UA was clear. I have reviewed his [place] report. I have reviewed records and labs from [place].  Assessment: Benign localized hyperplasia of prostate with urinary retention He has BPH with retention and a PVR of 1613ml and ARI that is improving post foley.    He is documented to have an elevated PSA of about 13 but his exam is benign.     I would continue the tamsulosin. He will need to have the foley for a couple of weeks and then f/u in my office for cystoscopy and a voiding trial. I will order a PSA, but it is likely to be falsely elevated in the face of acute retention.  He may need a prostate biopsy as well as the cystoscopy.   Contact me at [phone] if there are questions.      CC: Dr. Isaac Bliss.    Particia Strahm J 12/24/2014

## 2014-12-25 DIAGNOSIS — N401 Enlarged prostate with lower urinary tract symptoms: Secondary | ICD-10-CM

## 2014-12-25 DIAGNOSIS — N139 Obstructive and reflux uropathy, unspecified: Secondary | ICD-10-CM

## 2014-12-25 LAB — BASIC METABOLIC PANEL
Anion gap: 7 (ref 5–15)
BUN: 51 mg/dL — AB (ref 6–20)
CALCIUM: 8.7 mg/dL — AB (ref 8.9–10.3)
CHLORIDE: 112 mmol/L — AB (ref 101–111)
CO2: 24 mmol/L (ref 22–32)
CREATININE: 2.85 mg/dL — AB (ref 0.61–1.24)
GFR calc Af Amer: 22 mL/min — ABNORMAL LOW (ref >60)
GFR calc non Af Amer: 19 mL/min — ABNORMAL LOW (ref >60)
GLUCOSE: 100 mg/dL — AB (ref 65–99)
POTASSIUM: 4.4 mmol/L (ref 3.5–5.1)
Sodium: 143 mmol/L (ref 135–145)

## 2014-12-25 LAB — PSA, TOTAL AND FREE
PSA FREE: 1.74 ng/mL
PSA, Free Pct: 10.3 %
Prostate Specific Ag, Serum: 16.9 ng/mL — ABNORMAL HIGH (ref 0.0–4.0)

## 2014-12-25 MED ORDER — TAMSULOSIN HCL 0.4 MG PO CAPS: 0.4000 mg | ORAL_CAPSULE | Freq: Every day | ORAL | Status: DC

## 2014-12-25 NOTE — Progress Notes (Signed)
Patient discharged home.  IV removed - WNL.  Reviewed DC instructions with patient's niece who helps with patient's care.  Also demonstrated and given education on foley care and how to prevent UTI.  Follow up in place with urology.  Verbalizes understanding.  No questions at this time.  Stable to DC home, assisted off unit via [place] with NT assist.

## 2014-12-25 NOTE — Discharge Summary (Signed)
Physician Discharge Summary  Devon Griffin:[id number] DOB: [birth year] DOA: 12/23/2014  PCP: Placido Sou, MD  Admit date: 12/23/2014 Discharge date: 12/25/2014  Time spent: 35  minutes  Recommendations for Outpatient Follow-up:  1. Continue Flomax. 2. Maintain placement of foley catheter and follow up with Dr Jeffie Pollock, Urology, in a few weeks for cystoscopy and voiding trial. 3. Will need follow up with PCP in 2 weeks for blood pressure management, since Lisinopril/HCTZ were discontinued due to ARF.   Discharge Diagnoses:  Principal Problem:   AKI (acute kidney injury) Active Problems:   Anemia   Hypertension   Urinary obstruction   Essential hypertension   Benign localized hyperplasia of prostate with urinary retention   Discharge Condition: Improved  Diet recommendation: Regular   Filed Weights   12/24/14 0142 12/24/14 0519 12/25/14 0604  Weight: 87.907 kg (193 lb 12.8 oz) 87.091 kg (192 lb) 84.369 kg (186 lb)    History of present illness:  81 yo male independent comes in with over 48 hours of unable to urinate. Denies any abdominal pain, but says it was a little uncomfortable but feel better now. No fevers. No n/v/d. Foley placed on arrival to ED and over 1500cc urine resulted in bag. Pt says he has not had any sob. No new medications except castor oil he took days ago for constipation (which did the trick). He has chronic swelling to his legs which he reports is not new. His creatinine is over 7, referred for admission for his AKI and urinary obstruction.  Hospital Course:   ARF -Due to urinary obstruction.  -BUN 51 Creatinine 2.85, both increasingly improving.  -Renal US revealed there is severe bladder wall thickening vs complex material within the bladder surrounding the foley catheter. Correlate with clinical history.  Urinary obstruction  -Resolved after placement of foley. -Started on Flomax. -Discharge with foley catheter and GU follow up. -Seen by  urology this admission.  Anemia  -Chronic  -No indications for transfusion.   Hypertension -Well controlled.  -Lisinopril/HCTZ will be restarted pending PCP follow-up.  Procedures:  None  Consultations:  Urology- Irine Seal, MD    Discharge Instructions   Discharge Instructions    Continue foley catheter    Complete by:  As directed      Increase activity slowly    Complete by:  As directed           Current Discharge Medication List    START taking these medications   Details  tamsulosin (FLOMAX) 0.4 MG CAPS capsule Take 1 capsule (0.4 mg total) by mouth daily after supper. Qty: 30 capsule, Refills: 2      CONTINUE these medications which have NOT CHANGED   Details  docusate sodium (COLACE) 100 MG capsule Take 100 mg by mouth 2 (two) times daily.      ferrous sulfate 325 (65 FE) MG tablet Take 325 mg by mouth daily.       STOP taking these medications     lisinopril-hydrochlorothiazide (PRINZIDE,ZESTORETIC) 20-25 MG per tablet        No Known Allergies Follow-up Information    Follow up with Karen Kays, NP On 01/06/2015.   Specialty:  Nurse Practitioner   Why:  Appointment at the [facility] [address] at [employer] information:   [address] [place] [address] [phone]        The results of significant diagnostics from this hospitalization (including imaging, microbiology,  ancillary and laboratory) are listed below for reference.    Significant Diagnostic Studies: US Renal  12/24/2014   CLINICAL DATA:  Acute kidney injury  EXAM: RENAL / URINARY TRACT ULTRASOUND COMPLETE  COMPARISON:  None.  FINDINGS: Right Kidney:  Length: 7.4 cm. Echogenicity within normal limits. No mass or hydronephrosis visualized.  Left Kidney:  Length: 14 cm. Echogenicity within normal limits. 2.4 x 2.3 x 3 cm anechoic left upper pole renal mass with increased through transmission most consistent with a cyst. No  hydronephrosis visualized.  Bladder:  Foley catheter within the bladder. There is severe bladder wall thickening versus complex material within the bladder surrounding the Foley catheter.  IMPRESSION: 1. Atrophic right kidney. 2. No obstructive uropathy. 3. There is severe bladder wall thickening versus complex material within the bladder surrounding the Foley catheter. Correlate with clinical history.   Electronically Signed   By: Kathreen Devoid   On: 12/24/2014 09:24     Labs: Basic Metabolic Panel:  Recent Labs Lab 12/23/14 2122 12/24/14 0601 12/25/14 0546  NA 138 143 143  K 4.1 4.2 4.4  CL 107 113* 112*  CO2 19* 22 24  GLUCOSE 103* 89 100*  BUN 67* 60* 51*  CREATININE 7.39* 5.73* 2.85*  CALCIUM 8.7* 8.7* 8.7*   Liver Function Tests:  Recent Labs Lab 12/23/14 2122  AST 11*  ALT 11*  ALKPHOS 44  BILITOT 0.7  PROT 6.6  ALBUMIN 3.9   CBC:  Recent Labs Lab 12/23/14 2122 12/24/14 0601  WBC 6.1 4.4  NEUTROABS 4.0  --   HGB 8.8* 8.3*  HCT 27.4* 26.4*  MCV 97.2 98.5  PLT 122* 115*    Signed:  Domingo Mend, MD  [facility] 12/25/2014, 9:29 AM  I, Laban Emperor. Leonie Green, acting as scribe, recorded this note contemporaneously in the presence of Dr. Lelon Frohlich, M.D. on 12/25/2014..   I have reviewed the above documentation for accuracy and completeness, and I agree with the above.  Domingo Mend, MD [facility] Pager: [phone]

## 2014-12-26 NOTE — Progress Notes (Signed)
Niece called back this afternoon after patient was discharged home with foley catheter.  Patient lives alone but she and some other family check in on him.  Foley care and management was shown to her prior to DC with understanding verbalized.  She now has concerns about him being at home alone with the foley, though and risk of UTI.  Is questioning on how she could get him into a nursing facility.  She states that she will call patients PCP in AM and I left a note for social work with her number to see if there was any way to assist with the matter.

## 2014-12-28 NOTE — Clinical Social Work Note (Signed)
CSW spoke with Almyra Free, Education officer, museum at [employer]. Almyra Free advised that she had completed patient's FL2 and sent it to the [facility] earlier today. She indicated that she advised patient's niece, Leona Singleton,  that no doctors were in the office on yesterday and she could not get the [facility] signed until today. Almyra Free stated that she is unsure why patient's niece contacted that [facility] as she had advised that that she would complete the FL2 on today once the doctor was in the office to sign it.   Ihor Gully, [place]

## 2015-03-18 ENCOUNTER — Other Ambulatory Visit: Payer: Self-pay | Admitting: Urology

## 2015-03-18 NOTE — Progress Notes (Addendum)
Preop instructions for :  Devon Griffin    Date of Birth: Apr 08, 1929 Date of Procedure:   03/31/2015        Doctor:  Dr. Irine Seal  Time to arrive at [facility]:  0930 am  Report to: Admitting  Procedure time: 1200pm-130pm  Procedure:Transurethral resection of prostate, ultrasound of prostate and biopsy     Do not eat or drink past midnight the night before your procedure.(To include any tube feedings-must be discontinued)  Take these morning medications only with sips of water :   NONE  Facility to administer enema per Dr. Ralene Muskrat previous faxed order at 0600 am 03-31-15.                                    (or give through gastrostomy or feeding tube). Please see [place] preparing for surgery instructions enclosed. Note: No Insulin or Diabetic meds should be given or taken the morning of the procedure! Please see [place] preparing for surgery instructions sheet enclosed. Dr Jeffie Pollock office to fax order for Hibiclens shower night before surgery and morning of surgery.    Facility contact: Leona Singleton (neice)     Phone:(home)[phone], *[place]: niece-Devon Griffin  Transportation contact phone#: nieceLeona [phone]   Please send day of procedure:current med list and meds last taken that day, confirm nothing by mouth status from what time, Patient Demographic info( to include DNR status, problem list, allergies)                     Preop instructions for : Devon Griffin    Date of Birth 02-Dec-1928  RN contact name/phone#: Dessa Phi LPN  phone [phone]                                                                        and Fax [phone]   [place] card and picture ID Leave all jewelry and other valuables at place where living( no metal or rings to be worn) No contact lens Women-no make-up, no lotions,perfumes,powders Men-no colognes,lotions  Any questions day of procedure,call  Short Stay at [facility] , [phone]   Sent from :[facility]                   [place]                   Fax:[phone]  Sent by :RN: Zelphia Cairo

## 2015-03-21 ENCOUNTER — Encounter (HOSPITAL_COMMUNITY): Payer: Self-pay | Admitting: *Deleted

## 2015-03-22 NOTE — Progress Notes (Signed)
[facility]ed all pre op instructions to [facility] attention nicole lunsford LPN, spoke to nicole lunsford LPN all faxed instructions received and understood. [facility] to give enema 600 am 03-31-15 per dr wrenn's instructions [facility]ed to [facility].

## 2015-03-30 NOTE — H&P (Signed)
History of Present Illness He presents today for possible voiding trial. He was seen initially as a consultation in the hospital by Dr. Jeffie Pollock on August 12 of this year after the patient was admitted for urinary retention and AKI. The patient at that time stated he had not urinated in 2 days. His main complaint was mild dysuria. He did note over the past couple weeks that his stream had been decreasing and he had had increasing frequency and nocturia. There is no prior GU history or treatment for voiding problems. His creatinine upon admission was 7.39, a PVR showed 1600 mL so a catheter was placed. It was noted to be draining adequately. One day later his creatinine had declined to 5.73. He was placed on tension lessened. Upon discharge his creatinine had fallen further to around 2. Renal ultrasound indicated an atrophic right kidney, a normal-appearing left kidney with a simple upper pole cyst, and a thickened bladder wall. No intrarenal obstructive process was visualized.     The patient previously lived at home alone, but now has had placement in an assisted living facility in [place]. The catheter is noted to be draining well without evidence of bothersome lower urinary tract symptoms or gross hematuria. The patient has been afebrile and without other signs and symptoms of infection. He appears stable and in no acute danger, however he is wheelchair bound and noticeably weak.   Past Medical History Problems  1. History of arthritis (Z87.39) 2. History of hypertension (Z86.79)  Surgical History Problems  1. History of Appendectomy 2. History of Hernia Repair 3. History of Knee Replacement  Current Meds 1. Docusate Sodium 100 MG Oral Capsule;  Therapy: (Recorded:02Sep2016) to Recorded 2. Ferrous Sulfate 325 MG CAPS;  Therapy: (Recorded:02Sep2016) to Recorded 3. Tamsulosin HCl - 0.4 MG Oral Capsule;  Therapy: (Recorded:02Sep2016) to Recorded  Allergies Medication  1. No Known Drug  Allergies  Family History Problems  1. Family history of diabetes mellitus (Z83.3) : Mother 2. Family history of hypertension (Z82.49) : Sibling 3. Family history of lung cancer (Z80.1) : Sibling 4. Family history of prostate cancer (Z80.42) : Sibling 5. Family history of rectal cancer (Z80.0) : Father  Social History Problems    Denied: History of Caffeine use   Denied: History of Current smoker   Never smoker   Widowed  Review of Systems  Genitourinary: urinary frequency, nocturia, weak urinary stream and incomplete emptying of bladder.  ENT: hearing loss.  Musculoskeletal: joint pain and joint stiffness.  Neurological: difficulty walking.    Vitals Vital Signs [Data Includes: Last 1 Day]  Recorded: 02Sep2016 10:52AM  Height: 3 ft 1.5 in Weight: 188 lb  BMI Calculated: 93.99 BSA Calculated: 1.29 Blood Pressure: 94 / 60 Temperature: 97.9 F Heart Rate: 74  Physical Exam Constitutional: Well nourished and well developed . No acute distress.  ENT:. The ears and nose are normal in appearance.  Neck: The appearance of the neck is normal and no neck mass is present.  Pulmonary: No respiratory distress and normal respiratory rhythm and effort.  Cardiovascular: Heart rate and rhythm are normal . No peripheral edema.  Abdomen: The abdomen is soft and nontender. No masses are palpated. No CVA tenderness. No hernias are palpable. No hepatosplenomegaly noted.  Genitourinary: Examination of the penis demonstrates an indwelling catheter, but no discharge, no masses, no lesions and a normal meatus. The scrotum is without lesions. The right epididymis is palpably normal and non-tender. The left epididymis is palpably normal and non-tender. The right  testis is non-tender and without masses. The left testis is non-tender and without masses.  Skin: Normal skin turgor, no visible rash and no visible skin lesions.  Neuro/Psych:. Mood and affect are appropriate.    Assessment Assessed   1. Urinary retention (R33.9) 2. BPH (benign prostatic hypertrophy) with urinary obstruction (N40.1,N13.8)  Plan Urinary retention  1. Follow-up Office  Follow-up with me on Tuesday 09/06 for voiding trial  Status: Hold For -  Date of Service  Requested for: 06Sep2016  Discussion/Summary He was placed on my schedule today in the middle of the day. It is Friday before a 3 day holiday weekend. He lives an hour away with the nearest emergency department access being at least 30-45 minutes from his current living situation. My medical gestalt tells me that he would not be able to pass a voiding trial in the 15-30 minute office visit allotted time. I would hate to remove his catheter and send him home knowing that he has difficult access to the emergency services living in [place]. With that being said, I'm going to see him first thing Tuesday morning. That will give [place] ample time throughout the day for a voiding trial and also provides a buffer for him to return to the office before heading back to [facility] for reassessment should he not be able to void. Him and his caregiver present today both agree that this is a wise and acceptable treatment decision. He will continue on the tamsulosin in the interim.   History of Present Illness Mr. Lemelin returns today for urodynamics and cystoscopy for his history of retention.  He has been on bactrim for 3 days for his positive culture.   Vitals Vital Signs [Data Includes: Last 1 Day]  Recorded: [id number] 03:58PM  Blood Pressure: 111 / 67 Temperature: 98.4 F Heart Rate: 69  Procedure  Procedure: Cystoscopy   Indication: Lower Urinary Tract Symptoms.  Informed Consent: Risks, benefits, and potential adverse events were discussed and informed consent was obtained from the patient.  Prep: The patient was prepped with betadine.  Antibiotic prophylaxis: Trimethoprim/Sulfamethoxazole.  Procedure Note:  Urethral meatus:. No abnormalities.  Anterior  urethra: No abnormalities.  Prostatic urethra: No abnormalities . Estimated length was 3 cm. There was visual obstruction of the prostatic urethra. The lateral prostatic lobes were enlarged. No intravesical median lobe was visualized.  Bladder: Visulization was obscured due to cloudy urine. The ureteral orifices were in the normal anatomic position bilaterally and had clear efflux of urine. A systematic survey of the bladder demonstrated no bladder tumors or stones. The mucosa was smooth without abnormalities. Examination of the bladder demonstrated moderate trabeculation and a diverticulum located on the left side of the bladder measuring approximately 3 cm. A 16 fr foley was replaced to leg bag drainage. The patient tolerated the procedure well.  Complications: None.    Procedure: Urodynamics  Test indication: Retention.  The procedure's risks, benefits and infection risk were discussed with the patient.  Pre Uroflow & Catheterization  Procedure: Pre Uroflow Study. Equipment And Procedure:  The patient did not void arrived with a 16 fr foley to leg bag.  A urodynamic catheter was inserted.  The patient tolerated the procedure well.  Cystometry  Procedure: Cystometrogram.  The bladder was filled with room temperature water at a rate of less than 50 cc per minute.  After bladder filling, measurements obtained include the maximum cystometric capacity of approx. 434ml.  Maximum capacity produced a feeling of bladder fullness.  Bladder sensations. The  first sensation of filling occurred at 368ml. The normal desire to void occurred at 375ml. The strong desire to void occurred at 429ml. The bladder was unstable, the first contraction occurred at 346ml and the maximum unstable bladder contraction pressure was 65 cm H2O. His 1st void was off this unstable contraction. Injection of contrast was performed for the cystometrogram.  Leak Point Pressure  Procedure: Valsalva Leak Point Pressure.  The  patient was placed in the sitting position.  A fluoroscopic method was used to determine leak point pressure.  Pressure - Flow Study  Procedure: Pressure-Flow Study.  Findings: voluntary contraction generated, the volume voided was 95 ml, the maximum flow rate was 7 ml/s, the detrusor pressure at maximum flow was 57 cmH20, the maximum detrusor pressure was 66 cm H2O and the postvoid residual was approx. 385 ml. He said he felt in control of initiating this void.  Electromyogram  Procedure: Electromyogram. Equipment And Procedure:  Electromyogram activity was measured by surface electrodes.  Electromyogram activity increased during unstable bladder contractions and increased during voiding. Increased activity was more during involuntary voiding as expected.  Fluoroscopy and VCUG  Procedure: Fluoroscopy and VCUG.  During bladder filling, the contour was trabeculated. Findings included no reflux. Diverticuli were noted. There were no complications. Currently taking antibiotics.  Nurse Impression: Mr. Ohora held a max capacity of approx. 480 mls. He expressed his 1st sensation at 376 mls. He was having an unstable contraction at the time. His 1st void was off instability. Filling was continued, and he was able to generate a voluntary contraction and void. His pressures were high and his flow was obstructed. He did not empty leaving a pvr of approx. 385 mls. Trabeculation and a large left-sided diverticulum was noted. No reflux was seen. I drained his bladder prior to him leaving the UDS department. Mr. Fadley is seeing Dr Jeffie Pollock following his study today for a cystoscopy.  Procedure was supervised by Dr. Jeffie Pollock    Assessment Assessed  1. BPH (benign prostatic hypertrophy) with urinary obstruction (N40.1,N13.8) 2. Acute urinary retention (R33.8) 3. 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   Plan Acute urinary retention  1. Follow-up Schedule Surgery Office  Follow-up  Status: Hold For - Appointment   Requested for: [phone] 2. Cath, simple, wIinsert Temp Cath; Status:Complete;   [id number]  I discussed a variety of procedures for relieving the obstruction but I think he will be best served by TURP.  He will hold the rest of the bactrim and start it 3 days prior to the procedure. I have reviewed the risks of bleeding, infection, bladder injury, incontinence, stricturing, fluid overload, thrombosis and anesthetic complications.    His PSA is elevated at 16 so he will have a prostate [place] and biopsy at the time of the TURP.

## 2015-03-31 ENCOUNTER — Ambulatory Visit (HOSPITAL_COMMUNITY): Payer: Medicare Other | Admitting: Anesthesiology

## 2015-03-31 ENCOUNTER — Encounter (HOSPITAL_COMMUNITY): Payer: Self-pay | Admitting: Anesthesiology

## 2015-03-31 ENCOUNTER — Observation Stay (HOSPITAL_COMMUNITY)
Admission: RE | Admit: 2015-03-31 | Discharge: 2015-04-02 | Disposition: A | Discharge status: Skilled Nursing Facility | Payer: Medicare Other | Source: Ambulatory Visit | Attending: Urology | Admitting: Urology

## 2015-03-31 ENCOUNTER — Encounter (HOSPITAL_COMMUNITY)
Admission: RE | Disposition: A | Discharge status: Skilled Nursing Facility | Payer: Self-pay | Source: Ambulatory Visit | Attending: Urology

## 2015-03-31 DIAGNOSIS — M1990 Unspecified osteoarthritis, unspecified site: Secondary | ICD-10-CM | POA: Diagnosis not present

## 2015-03-31 DIAGNOSIS — R3914 Feeling of incomplete bladder emptying: Secondary | ICD-10-CM | POA: Insufficient documentation

## 2015-03-31 DIAGNOSIS — N401 Enlarged prostate with lower urinary tract symptoms: Principal | ICD-10-CM | POA: Diagnosis present

## 2015-03-31 DIAGNOSIS — N138 Other obstructive and reflux uropathy: Secondary | ICD-10-CM | POA: Insufficient documentation

## 2015-03-31 DIAGNOSIS — R972 Elevated prostate specific antigen [PSA]: Secondary | ICD-10-CM | POA: Diagnosis not present

## 2015-03-31 DIAGNOSIS — I10 Essential (primary) hypertension: Secondary | ICD-10-CM | POA: Diagnosis not present

## 2015-03-31 DIAGNOSIS — C61 Malignant neoplasm of prostate: Secondary | ICD-10-CM | POA: Insufficient documentation

## 2015-03-31 DIAGNOSIS — R338 Other retention of urine: Secondary | ICD-10-CM | POA: Insufficient documentation

## 2015-03-31 DIAGNOSIS — Z79899 Other long term (current) drug therapy: Secondary | ICD-10-CM | POA: Diagnosis not present

## 2015-03-31 DIAGNOSIS — R339 Retention of urine, unspecified: Secondary | ICD-10-CM | POA: Diagnosis present

## 2015-03-31 DIAGNOSIS — Z96659 Presence of unspecified artificial knee joint: Secondary | ICD-10-CM | POA: Insufficient documentation

## 2015-03-31 DIAGNOSIS — R350 Frequency of micturition: Secondary | ICD-10-CM | POA: Diagnosis not present

## 2015-03-31 DIAGNOSIS — R351 Nocturia: Secondary | ICD-10-CM | POA: Insufficient documentation

## 2015-03-31 HISTORY — PX: PROSTATE BIOPSY: SHX241

## 2015-03-31 HISTORY — PX: TRANSURETHRAL RESECTION OF PROSTATE: SHX73

## 2015-03-31 LAB — CBC
HCT: 26.3 % — ABNORMAL LOW (ref 39.0–52.0)
Hemoglobin: 8.3 g/dL — ABNORMAL LOW (ref 13.0–17.0)
MCH: 31.2 pg (ref 26.0–34.0)
MCHC: 31.6 g/dL (ref 30.0–36.0)
MCV: 98.9 fL (ref 78.0–100.0)
Platelets: 143 10*3/uL — ABNORMAL LOW (ref 150–400)
RBC: 2.66 MIL/uL — ABNORMAL LOW (ref 4.22–5.81)
RDW: 14.9 % (ref 11.5–15.5)
WBC: 4.1 10*3/uL (ref 4.0–10.5)

## 2015-03-31 LAB — BASIC METABOLIC PANEL
Anion gap: 8 (ref 5–15)
BUN: 47 mg/dL — ABNORMAL HIGH (ref 6–20)
CO2: 23 mmol/L (ref 22–32)
CREATININE: 1.43 mg/dL — AB (ref 0.61–1.24)
Calcium: 9 mg/dL (ref 8.9–10.3)
Chloride: 109 mmol/L (ref 101–111)
GFR calc Af Amer: 50 mL/min — ABNORMAL LOW (ref >60)
GFR calc non Af Amer: 43 mL/min — ABNORMAL LOW (ref >60)
GLUCOSE: 85 mg/dL (ref 65–99)
Potassium: 4.7 mmol/L (ref 3.5–5.1)
SODIUM: 140 mmol/L (ref 135–145)

## 2015-03-31 LAB — HEMOGLOBIN AND HEMATOCRIT, BLOOD
HEMATOCRIT: 26.4 % — AB (ref 39.0–52.0)
HEMOGLOBIN: 8.3 g/dL — AB (ref 13.0–17.0)

## 2015-03-31 SURGERY — TURP (TRANSURETHRAL RESECTION OF PROSTATE)
Anesthesia: General | Site: Prostate

## 2015-03-31 MED ORDER — ZOLPIDEM TARTRATE 5 MG PO TABS: 5.0000 mg | ORAL_TABLET | Freq: Every evening | ORAL | Status: DC | PRN

## 2015-03-31 MED ORDER — ONDANSETRON HCL 4 MG/2ML IJ SOLN: 4.0000 mg | INTRAMUSCULAR | Status: DC | PRN

## 2015-03-31 MED ORDER — BISACODYL 10 MG RE SUPP: 10.0000 mg | Freq: Every day | RECTAL | Status: DC | PRN

## 2015-03-31 MED ORDER — DEXTROSE 5 % IV SOLN
1.0000 g | INTRAVENOUS | Status: DC
  Administered 2015-04-01 – 2015-04-02 (×2): 1 g via INTRAVENOUS
  Filled 2015-03-31 (×2): qty 10

## 2015-03-31 MED ORDER — ONDANSETRON HCL 4 MG/2ML IJ SOLN
INTRAMUSCULAR | Status: DC | PRN
  Administered 2015-03-31: 4 mg via INTRAVENOUS

## 2015-03-31 MED ORDER — ONDANSETRON HCL 4 MG/2ML IJ SOLN
INTRAMUSCULAR | Status: AC
  Filled 2015-03-31: qty 2

## 2015-03-31 MED ORDER — POLYETHYLENE GLYCOL 3350 17 G PO PACK: 17.0000 g | PACK | Freq: Every day | ORAL | Status: DC | PRN

## 2015-03-31 MED ORDER — HYOSCYAMINE SULFATE 0.125 MG SL SUBL
0.1250 mg | SUBLINGUAL_TABLET | SUBLINGUAL | Status: DC | PRN
  Filled 2015-03-31: qty 1

## 2015-03-31 MED ORDER — FUROSEMIDE 20 MG PO TABS
20.0000 mg | ORAL_TABLET | Freq: Every day | ORAL | Status: DC
  Administered 2015-03-31 – 2015-04-02 (×3): 20 mg via ORAL
  Filled 2015-03-31 (×3): qty 1

## 2015-03-31 MED ORDER — SUGAMMADEX SODIUM 200 MG/2ML IV SOLN
INTRAVENOUS | Status: AC
  Filled 2015-03-31: qty 2

## 2015-03-31 MED ORDER — FERROUS SULFATE 325 (65 FE) MG PO TABS
325.0000 mg | ORAL_TABLET | Freq: Every day | ORAL | Status: DC
  Administered 2015-03-31 – 2015-04-02 (×3): 325 mg via ORAL
  Filled 2015-03-31 (×3): qty 1

## 2015-03-31 MED ORDER — DEXTROSE 5 % IV SOLN
2.0000 g | INTRAVENOUS | Status: AC
  Administered 2015-03-31: 2 g via INTRAVENOUS

## 2015-03-31 MED ORDER — PROPOFOL 10 MG/ML IV BOLUS
INTRAVENOUS | Status: DC | PRN
  Administered 2015-03-31: 100 mg via INTRAVENOUS

## 2015-03-31 MED ORDER — PHENYLEPHRINE 40 MCG/ML (10ML) SYRINGE FOR IV PUSH (FOR BLOOD PRESSURE SUPPORT)
PREFILLED_SYRINGE | INTRAVENOUS | Status: AC
  Filled 2015-03-31: qty 10

## 2015-03-31 MED ORDER — DEXAMETHASONE SODIUM PHOSPHATE 10 MG/ML IJ SOLN
INTRAMUSCULAR | Status: AC
  Filled 2015-03-31: qty 1

## 2015-03-31 MED ORDER — HYDROCODONE-ACETAMINOPHEN 5-325 MG PO TABS: 1.0000 | ORAL_TABLET | ORAL | Status: DC | PRN

## 2015-03-31 MED ORDER — ROCURONIUM BROMIDE 100 MG/10ML IV SOLN
INTRAVENOUS | Status: AC
  Filled 2015-03-31: qty 3

## 2015-03-31 MED ORDER — DIPHENHYDRAMINE HCL 12.5 MG/5ML PO ELIX: 12.5000 mg | ORAL_SOLUTION | Freq: Four times a day (QID) | ORAL | Status: DC | PRN

## 2015-03-31 MED ORDER — SODIUM POLYSTYRENE SULFONATE PO POWD: Freq: Once | ORAL | Status: DC

## 2015-03-31 MED ORDER — SUGAMMADEX SODIUM 200 MG/2ML IV SOLN
INTRAVENOUS | Status: DC | PRN
  Administered 2015-03-31: 200 mg via INTRAVENOUS

## 2015-03-31 MED ORDER — SODIUM CHLORIDE 0.9 % IR SOLN
Status: DC | PRN
  Administered 2015-03-31: 12000 mL via INTRAVESICAL

## 2015-03-31 MED ORDER — GENTAMICIN SULFATE 40 MG/ML IJ SOLN
400.0000 mg | INTRAVENOUS | Status: AC
  Administered 2015-03-31: 400 mg via INTRAVENOUS
  Filled 2015-03-31: qty 10

## 2015-03-31 MED ORDER — LACTATED RINGERS IV SOLN
INTRAVENOUS | Status: DC
  Administered 2015-03-31 (×2): via INTRAVENOUS
  Administered 2015-03-31: 1000 mL via INTRAVENOUS

## 2015-03-31 MED ORDER — SUCCINYLCHOLINE CHLORIDE 20 MG/ML IJ SOLN
INTRAMUSCULAR | Status: DC | PRN
  Administered 2015-03-31: 100 mg via INTRAVENOUS

## 2015-03-31 MED ORDER — MAGNESIUM HYDROXIDE 400 MG/5ML PO SUSP: 30.0000 mL | Freq: Every day | ORAL | Status: DC | PRN

## 2015-03-31 MED ORDER — KCL IN DEXTROSE-NACL 20-5-0.45 MEQ/L-%-% IV SOLN
INTRAVENOUS | Status: DC
  Administered 2015-03-31: 75 mL/h via INTRAVENOUS
  Administered 2015-04-01 (×2): via INTRAVENOUS
  Filled 2015-03-31 (×5): qty 1000

## 2015-03-31 MED ORDER — ROCURONIUM BROMIDE 100 MG/10ML IV SOLN
INTRAVENOUS | Status: DC | PRN
  Administered 2015-03-31: 20 mg via INTRAVENOUS
  Administered 2015-03-31: 15 mg via INTRAVENOUS

## 2015-03-31 MED ORDER — ADULT MULTIVITAMIN W/MINERALS CH
1.0000 | ORAL_TABLET | Freq: Every day | ORAL | Status: DC
  Administered 2015-03-31 – 2015-04-02 (×3): 1 via ORAL
  Filled 2015-03-31 (×3): qty 1

## 2015-03-31 MED ORDER — FENTANYL CITRATE (PF) 100 MCG/2ML IJ SOLN: 25.0000 ug | INTRAMUSCULAR | Status: DC | PRN

## 2015-03-31 MED ORDER — ACETAMINOPHEN 325 MG PO TABS: 650.0000 mg | ORAL_TABLET | Freq: Four times a day (QID) | ORAL | Status: DC | PRN

## 2015-03-31 MED ORDER — DEXAMETHASONE SODIUM PHOSPHATE 10 MG/ML IJ SOLN
INTRAMUSCULAR | Status: DC | PRN
  Administered 2015-03-31: 10 mg via INTRAVENOUS

## 2015-03-31 MED ORDER — PHENYLEPHRINE HCL 10 MG/ML IJ SOLN
INTRAMUSCULAR | Status: DC | PRN
  Administered 2015-03-31: 40 ug via INTRAVENOUS
  Administered 2015-03-31: 80 ug via INTRAVENOUS

## 2015-03-31 MED ORDER — DIPHENHYDRAMINE HCL 50 MG/ML IJ SOLN: 12.5000 mg | Freq: Four times a day (QID) | INTRAMUSCULAR | Status: DC | PRN

## 2015-03-31 MED ORDER — LIDOCAINE HCL (CARDIAC) 20 MG/ML IV SOLN
INTRAVENOUS | Status: AC
  Filled 2015-03-31: qty 5

## 2015-03-31 MED ORDER — DOCUSATE SODIUM 100 MG PO CAPS
100.0000 mg | ORAL_CAPSULE | Freq: Two times a day (BID) | ORAL | Status: DC
  Administered 2015-03-31 – 2015-04-02 (×4): 100 mg via ORAL
  Filled 2015-03-31 (×5): qty 1

## 2015-03-31 MED ORDER — FENTANYL CITRATE (PF) 100 MCG/2ML IJ SOLN
INTRAMUSCULAR | Status: AC
  Filled 2015-03-31: qty 2

## 2015-03-31 MED ORDER — TRAMADOL HCL 50 MG PO TABS: 50.0000 mg | ORAL_TABLET | Freq: Four times a day (QID) | ORAL | Status: DC | PRN

## 2015-03-31 MED ORDER — FENTANYL CITRATE (PF) 100 MCG/2ML IJ SOLN
INTRAMUSCULAR | Status: DC | PRN
  Administered 2015-03-31 (×2): 50 ug via INTRAVENOUS

## 2015-03-31 MED ORDER — CEFTRIAXONE SODIUM 2 G IJ SOLR
INTRAMUSCULAR | Status: AC
  Filled 2015-03-31: qty 2

## 2015-03-31 MED ORDER — LIDOCAINE HCL (CARDIAC) 20 MG/ML IV SOLN
INTRAVENOUS | Status: DC | PRN
  Administered 2015-03-31: 50 mg via INTRAVENOUS

## 2015-03-31 MED ORDER — LISINOPRIL 20 MG PO TABS
20.0000 mg | ORAL_TABLET | Freq: Every day | ORAL | Status: DC
  Administered 2015-03-31 – 2015-04-02 (×3): 20 mg via ORAL
  Filled 2015-03-31 (×3): qty 1

## 2015-03-31 MED ORDER — EPHEDRINE SULFATE 50 MG/ML IJ SOLN
INTRAMUSCULAR | Status: DC | PRN
  Administered 2015-03-31: 5 mg via INTRAVENOUS
  Administered 2015-03-31 (×3): 10 mg via INTRAVENOUS
  Administered 2015-03-31: 5 mg via INTRAVENOUS

## 2015-03-31 MED ORDER — HYDROMORPHONE HCL 1 MG/ML IJ SOLN: 0.5000 mg | INTRAMUSCULAR | Status: DC | PRN

## 2015-03-31 SURGICAL SUPPLY — 16 items
BAG URINE DRAINAGE (UROLOGICAL SUPPLIES) ×2 IMPLANT
BAG URO CATCHER STRL LF (DRAPE) ×3 IMPLANT
CATH FOLEY 3WAY 30CC 22FR (CATHETERS) ×2 IMPLANT
ELECT REM PT RETURN 9FT ADLT (ELECTROSURGICAL) ×3
ELECTRODE REM PT RTRN 9FT ADLT (ELECTROSURGICAL) ×1 IMPLANT
GLOVE SURG SS PI 8.0 STRL IVOR (GLOVE) IMPLANT
GOWN STRL REUS W/TWL XL LVL3 (GOWN DISPOSABLE) ×3 IMPLANT
HOLDER FOLEY CATH W/STRAP (MISCELLANEOUS) ×2 IMPLANT
KIT ASPIRATION TUBING (SET/KITS/TRAYS/PACK) ×3 IMPLANT
LOOP CUT BIPOLAR 24F LRG (ELECTROSURGICAL) ×2 IMPLANT
MANIFOLD NEPTUNE II (INSTRUMENTS) ×3 IMPLANT
PACK CYSTO (CUSTOM PROCEDURE TRAY) ×3 IMPLANT
SYR 30ML LL (SYRINGE) ×4 IMPLANT
SYRINGE IRR TOOMEY STRL 70CC (SYRINGE) ×2 IMPLANT
TUBING CONNECTING 10 (TUBING) ×2 IMPLANT
TUBING CONNECTING 10' (TUBING) ×1

## 2015-03-31 NOTE — Progress Notes (Signed)
PHARMACY NOTE -  Rocephin  Pharmacy has been assisting with dosing of Rocephin s/p TURP Dosage remains stable at 1g q24 hr and no need for further dosage adjustment as Rocephin needs no renal adjustment    Will sign off at this time.  Please reconsult if a change in clinical status warrants re-evaluation of dosage.  Minda Ditto PharmD Pager [phone] 03/31/2015, 5:01 PM

## 2015-03-31 NOTE — Transfer of Care (Signed)
Immediate Anesthesia Transfer of Care Note  Patient: Devon Griffin  Procedure(s) Performed: Procedure(s): TRANSURETHRAL RESECTION OF THE PROSTATE (TURP) (N/A) PROSTATE ULTRASOUND AND BIOPSY (N/A)  Patient Location: PACU  Anesthesia Type:General  Level of Consciousness:  sedated, patient cooperative and responds to stimulation  Airway & Oxygen Therapy:Patient Spontanous Breathing and Patient connected to face mask oxgen  Post-op Assessment:  Report given to PACU RN and Post -op Vital signs reviewed and stable  Post vital signs:  Reviewed and stable  Last Vitals:  Filed Vitals:   03/31/15 1012  BP: 132/70  Pulse: 65  Temp: 36.5 C  Resp: 16    Complications: No apparent anesthesia complications

## 2015-03-31 NOTE — Brief Op Note (Signed)
03/31/2015  1:48 PM  PATIENT:  Orest Dikes  81 y.o. male  PRE-OPERATIVE DIAGNOSIS:  BPH WITH RETENTION & ELEVATED PSA  POST-OPERATIVE DIAGNOSIS:  BPH WITH RETENTION & ELEVATED PSA PROCEDURE:  Procedure(s): TRANSURETHRAL RESECTION OF THE PROSTATE (TURP) (N/A) PROSTATE ULTRASOUND AND BIOPSY (N/A)  SURGEON:  Surgeon(s) and Role:    * Irine Seal, MD - Primary  PHYSICIAN ASSISTANT:   ASSISTANTS: none   ANESTHESIA:   general  EBL:  Total I/O In: 1000 [I.V.:1000] Out: -   BLOOD ADMINISTERED:none  DRAINS: Urinary Catheter (Foley)   LOCAL MEDICATIONS USED:  NONE  SPECIMEN:  Source of Specimen:  12 core prostate biopsy and TURP chips.  DISPOSITION OF SPECIMEN:  PATHOLOGY  COUNTS:  YES  TOURNIQUET:  * No tourniquets in log *  DICTATION: .Other Dictation: Dictation Number [id number]  PLAN OF CARE: Admit for overnight observation  PATIENT DISPOSITION:  PACU - hemodynamically stable.   Delay start of Pharmacological VTE agent (>24hrs) due to surgical blood loss or risk of bleeding: yes

## 2015-03-31 NOTE — Progress Notes (Addendum)
Patient ID: Devon Griffin, male   DOB: 1929/02/10, 81 y.o.   MRN: [id number]  Mr. Wax is doing well post op.  His urine is light pink on CBI.  Hgb is stable at 8.3.  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   He had an abdominal US in August and the right kidney was 7cm with a 13.5cm left kidney, but on my review of the films, the structure that is being measured as the kidney doesn't appear very renaform.   He probably has a solitary left kidney.

## 2015-03-31 NOTE — Discharge Instructions (Signed)
Transurethral Resection of the Prostate, Care After °Refer to this sheet in the next few weeks. These instructions provide you with information on caring for yourself after your procedure. Your caregiver also may give you specific instructions. Your treatment has been planned according to current medical practices, but complications sometimes occur. Call your caregiver if you have any problems or questions after your procedure. °HOME CARE INSTRUCTIONS  °Recovery can take 4-6 weeks. Avoid alcohol, caffeinated drinks, and spicy foods for 2 weeks after your procedure. Drink enough fluids to keep your urine clear or pale yellow. Urinate as soon as you feel the urge to do so. Do not try to hold your urine for long periods of time. °During recovery you may experience pain caused by bladder spasms, which result in a very intense urge to urinate. Take all medicines as directed by your caregiver, including medicines for pain. Try to limit the amount of pain medicines you take because it can cause constipation. If you do become constipated, do not strain to move your bowels. Straining can increase bleeding. Constipation can be minimized by increasing the amount fluids and fiber in your diet. Your caregiver also may prescribe a stool softener. °Do not lift heavy objects (more than 5 lb [2.25 kg]) or perform exercises that cause you to strain for at least 1 month after your procedure. When sitting, you may want to sit in a soft chair or use a cushion. For the first 10 days after your procedure, avoid the following activities: °· Running. °· Strenuous work. °· Long walks. °· Riding in a car for extended periods. °· Sex. °SEEK MEDICAL CARE IF: °· You have difficulty urinating. °· You have blood in your urine that does not go away after you rest or increase your fluid intake. °· You have swelling in your penis or scrotum. °SEEK IMMEDIATE MEDICAL CARE IF:  °· You are suddenly unable to urinate. °· You notice blood clots in your  urine. °· You have chills. °· You have a fever. °· You have pain in your back or lower abdomen. °· You have pain or swelling in your legs. °MAKE SURE YOU:  °· Understand these instructions. °· Will watch your condition. °· Will get help right away if you are not doing well or get worse. °  °This information is not intended to replace advice given to you by your health care provider. Make sure you discuss any questions you have with your health care provider. °  °Document Released: 04/30/2005 Document Revised: 05/21/2014 Document Reviewed: 06/08/2011 °Elsevier Interactive Patient Education ©2016 [employer] ° °

## 2015-03-31 NOTE — Interval H&P Note (Signed)
History and Physical Interval Note:  03/31/2015 12:19 PM  Devon Griffin  has presented today for surgery, with the diagnosis of BPH WITH RETENTION  The various methods of treatment have been discussed with the patient and family. After consideration of risks, benefits and other options for treatment, the patient has consented to  Procedure(s): TRANSURETHRAL RESECTION OF THE PROSTATE (TURP) (N/A) PROSTATE ULTRASOUND AND BIOPSY (N/A) as a surgical intervention .  The patient's history has been reviewed, patient examined, no change in status, stable for surgery.  I have reviewed the patient's chart and labs.  Questions were answered to the patient's satisfaction.     Kutter Schnepf J

## 2015-03-31 NOTE — Anesthesia Procedure Notes (Signed)
Procedure Name: Intubation Date/Time: 03/31/2015 12:40 PM Performed by: Whittany Parish, Virgel Gess Pre-anesthesia Checklist: Patient identified, Emergency Drugs available, Suction available, Patient being monitored and Timeout performed Patient Re-evaluated:Patient Re-evaluated prior to inductionOxygen Delivery Method: Circle system utilized Preoxygenation: Pre-oxygenation with 100% oxygen Intubation Type: IV induction Ventilation: Mask ventilation without difficulty Laryngoscope Size: Mac and 4 Grade View: Grade I Tube type: Oral Tube size: 7.5 mm Number of attempts: 1 Airway Equipment and Method: Stylet Placement Confirmation: ETT inserted through vocal cords under direct vision,  positive ETCO2,  CO2 detector and breath sounds checked- equal and bilateral Secured at: 23 cm Tube secured with: Tape Dental Injury: Teeth and Oropharynx as per pre-operative assessment

## 2015-03-31 NOTE — Anesthesia Preprocedure Evaluation (Addendum)
Anesthesia Evaluation  Patient identified by MRN, date of birth, ID band Patient awake    Reviewed: Allergy & Precautions, NPO status   Airway Mallampati: II  TM Distance: >3 FB Neck ROM: Full    Dental   Pulmonary neg pulmonary ROS,    breath sounds clear to auscultation       Cardiovascular hypertension,  Rhythm:Regular Rate:Normal     Neuro/Psych negative neurological ROS     GI/Hepatic negative GI ROS, Neg liver ROS,   Endo/Other    Renal/GU Renal disease     Musculoskeletal  (+) Arthritis ,   Abdominal   Peds  Hematology  (+) anemia ,   Anesthesia Other Findings   Reproductive/Obstetrics                            Anesthesia Physical Anesthesia Plan  ASA: III  Anesthesia Plan: General   Post-op Pain Management:    Induction: Intravenous  Airway Management Planned: Oral ETT  Additional Equipment:   Intra-op Plan:   Post-operative Plan: Extubation in OR  Informed Consent: I have reviewed the patients History and Physical, chart, labs and discussed the procedure including the risks, benefits and alternatives for the proposed anesthesia with the patient or authorized representative who has indicated his/her understanding and acceptance.   Dental advisory given  Plan Discussed with: CRNA and Anesthesiologist  Anesthesia Plan Comments:         Anesthesia Quick Evaluation

## 2015-04-01 DIAGNOSIS — N401 Enlarged prostate with lower urinary tract symptoms: Secondary | ICD-10-CM | POA: Diagnosis not present

## 2015-04-01 LAB — HEMOGLOBIN AND HEMATOCRIT, BLOOD
HEMATOCRIT: 25.4 % — AB (ref 39.0–52.0)
HEMOGLOBIN: 8.2 g/dL — AB (ref 13.0–17.0)

## 2015-04-01 MED ORDER — DOCUSATE SODIUM 100 MG PO CAPS: 100.0000 mg | ORAL_CAPSULE | Freq: Two times a day (BID) | ORAL | Status: DC

## 2015-04-01 NOTE — Progress Notes (Signed)
1 Day Post-Op PM  Subjective: Patient reports that he is doing well. Denies pain, nausea, vomiting, severe bladder spasms. Foley catheter with thin pink urine off CBI and up in chair. No clots or debris. Eating lots of food and "feels great".   Objective: Vital signs in last 24 hours: Temp:  [97.5 F (36.4 C)-97.9 F (36.6 C)] 97.9 F (36.6 C) (11/18 1530) Pulse Rate:  [67-74] 67 (11/18 1530) Resp:  [12-18] 18 (11/18 1530) BP: (112-130)/(59-66) 120/65 mmHg (11/18 1530) SpO2:  [95 %-99 %] 99 % (11/18 1530)  Intake/Output from previous day: 11/17 0701 - 11/18 0700 In: 24862.5 [P.O.:120; I.V.:1942.5] Out: 28050 [Urine:28050] Intake/Output this shift: Total I/O In: 460 [P.O.:460] Out: 1475 [Urine:1475]  Physical Exam:  General:alert and cooperative GI: soft, non tender, normal bowel sounds, no palpable masses, no organomegaly, no inguinal hernia Male genitalia: Foley catheter in place draining thin pink urine off CBI. No debris or clots. Extremities: extremities normal, atraumatic, no cyanosis or edema  Lab Results:  Recent Labs  03/31/15 1053 03/31/15 1435 04/01/15 0528  HGB 8.3* 8.3* 8.2*  HCT 26.3* 26.4* 25.4*   BMET  Recent Labs  03/31/15 1053  NA 140  K 4.7  CL 109  CO2 23  GLUCOSE 85  BUN 47*  CREATININE 1.43*  CALCIUM 9.0   No results for input(s): LABPT, INR in the last 72 hours. No results for input(s): LABURIN in the last 72 hours. Results for orders placed or performed during the hospital encounter of 08/14/11  Surgical pcr screen     Status: None   Collection Time: 08/14/11 10:05 AM  Result Value Ref Range Status   MRSA, PCR NEGATIVE NEGATIVE Final   Staphylococcus aureus NEGATIVE NEGATIVE Final    Comment:        The Xpert SA Assay (FDA approved for NASAL specimens only), is one component of a comprehensive surveillance program.  It is not intended to diagnose infection nor to guide or monitor treatment.    Studies/Results: No results  found.  Assessment/Plan: 81 yo M POD#1 TURP and prostate biopsy who is recovering well. Urine is light pink off CBI and up in the chair. Hemoglobin stable. VSS. Excellent UOP. Feels well.  - CBI stopped AM of POD#1 - Plan to remove foley Saturday AM at 0500 - Will perform TOV tomorrow prior to discharge - Continue regular diet - [employer] - OOB to chair/wheelchair - SCDs     Devon Griffin 04/01/2015, 4:10 PM

## 2015-04-01 NOTE — Op Note (Signed)
Devon Griffin, Devon Griffin               ACCOUNT NO.:  [id number]  MEDICAL RECORD NO.:  [id number]  LOCATION:  75                         FACILITY:  [facility]  PHYSICIAN:  Marshall Cork. Jeffie Pollock, M.D.    DATE OF BIRTH:  05-14-1929  DATE OF PROCEDURE:  03/31/2015 DATE OF DISCHARGE:                              OPERATIVE REPORT   PROCEDURE: 1. Transrectal ultrasound of the prostate. 2. Transrectal ultrasound-guided prostate biopsy. 3. Transurethral resection of prostate.  PREOPERATIVE DIAGNOSIS:  Benign prostatic hyperplasia (BPH) with retention and an elevated prostate-specific antigen (PSA).  POSTOPERATIVE DIAGNOSIS:  Benign prostatic hyperplasia (BPH) with retention and an elevated prostate-specific antigen (PSA).  SURGEON:  Marshall Cork. Jeffie Pollock, M.D.  ANESTHESIA:  General.  SPECIMEN:  12-core prostate biopsy and TUR chips.  DRAINS:  A 22-French, 3 way Foley catheter.  BLOOD LOSS:  Minimal.  COMPLICATIONS:  None.  INDICATIONS:  Devon Griffin is an 81 year old African American male with urinary retention.  His PSA was noted to be 16.  He has failed conservative therapy and is to undergo transurethral resection of the prostate, with prostate ultrasound and biopsy.  FINDINGS OF PROCEDURE:  He was given Rocephin and gentamicin preoperatively.  He was taken to the operating room, where general anesthetic was induced.  He was placed in lithotomy position with the PAS hose.  The 10 megahertz rectal ultrasound probe was assembled and inserted and scanning was performed.  This demonstrated a 34.75 mL of prostate with a width of 5.39 cm, a height 3.28 cm and a length of 3.82 cm.  There were normal-appearing seminal vesicles.  The left seminal vesicle was normal. The right seminal vesicle appeared to be atretic or possibly absent. The prostate had a normal peripheral echotexture.  The transitional zone was somewhat enlarged.  There was scattered calcifications.  No middle lobe has been noted.  Once  the initial scan was performed, a 12-core biopsy was obtained in usual configuration.  At this point, the ultrasound probe was removed.  Examination was carried and revealed a left vas deferens, but no right vas deferens. The patient was then prepped with Betadine solution and draped in the usual sterile fashion.  The 28-French continuous flow resectoscope sheath was placed in the area of the visual obturator.  The prostate was approximately 3 cm in length with bilobar hyperplasia and it makes to a small middle lobe.  Inspection of the bladder demonstrated marked trabeculation with an approximately 3-4 cm diverticulum on the right. The left lateral wall.  I was then able to identify the ureteral orifices because of the multiple cellules.  No tumors or stones were noted, but there was some catheter irritation on the posterior wall.  Once initial cystoscopic inspection was performed.  The scope was fitted with an Beatrix Fetters handle with the bipolar loop and 30-degree lens. Saline was used as the irrigant.  The prostate was then resected with the initial resection of the bladder neck, exposing the fibers from 5 to 7 o'clock.  The floor of the floor of the prostate was then resected out to alongside the verumontanum.  The right lobe of the prostate was resected from bladder neck to apex followed by the left  lobe of the prostate.  The prostatic chips were then retrieved with additional apical anterior and floor tissue was resected.  At this point, a final chip removal was performed and hemostasis was achieved.  The diverticulum was inspected to ensure that no chips were retained within the diverticulum.  At this point, the scope was removed.  Pressure on the bladder produced an excellent stream.  The 22-French three-way Foley catheter was inserted with the aid of a catheter guide.  The balloon was filled with 30 mL of sterile fluid.  The catheter was in place to continuous irrigation and  irrigated with an aseptic syringe with clear return.  The catheter was then placed to straight drainage.  The patient was then taken down from lithotomy position.  His anesthetic was reversed.  He was moved to recovery room in stable condition.  There were no complications.     Marshall Cork. Jeffie Pollock, M.D.     JJW/MEDQ  D:  03/31/2015  T:  04/01/2015  Job:  [id number]

## 2015-04-01 NOTE — Clinical Social Work Note (Signed)
Clinical Social Work Assessment  Patient Details  Name: Devon Griffin MRN: [id number] Date of Birth: 1928-11-05  Date of referral:  04/01/15               Reason for consult:  Discharge Planning                Permission sought to share information with:  Family Supports Permission granted to share information::  Yes, Verbal Permission Granted  Name::     Devon Griffin  Agency::     Relationship::  Niece  Contact Information:  [phone]  Housing/Transportation Living arrangements for the past 2 months:  [place] of Information:  Patient, Other (Comment Required) (pt niece) Patient Interpreter Needed:  None Criminal Activity/Legal Involvement Pertinent to Current Situation/Hospitalization:  No - Comment as needed Significant Relationships:  Other Family Members Lives with:  Facility Resident Do you feel safe going back to the place where you live?  Yes Need for family participation in patient care:  Yes (Comment)  Care giving concerns:  Pt admitted from [facility]. Pt and pt niece did not identify any care giving concerns at facility.   Social Worker assessment / plan:  CSW received referral that pt admitted from [facility] and anticipation is that pt will d/c back to SNF on Saturday 11/19.  CSW met with pt and pt niece, Devon Griffin at bedside. CSW introduced self and explained role. Pt confirmed he is a resident at [facility] and plans to return when medically ready. Pt and pt niece aware of plan for discharge tomorrow. Pt niece states that she can transport pt back to SNF and asked to be contacted tomorrow when pt ready for transportation back to SNF.   CSW contacted [facility] and confirmed that facility can accept pt back tomorrow. CSW completed FL2 and sent clinicals to [facility].   Weekend CSW to continue to follow to assist with pt discharge to [facility].  Employment  status:  Retired Forensic scientist:  Medicare PT Recommendations:  Not assessed at this time [place] / Referral to community resources:  [place]  Patient/Family's Response to care:  Pt alert and oriented x 4. Pt hard of hearing. Pt and pt niece agreeable to plan to return to [facility].   Patient/Family's Understanding of and Emotional Response to Diagnosis, Current Treatment, and Prognosis:  Pt and pt niece displayed understanding surrounding current diagnosis and plan and described to CSW that plan is for foley catheter to be removed tomorrow prior to d/c.   Emotional Assessment Appearance:  Appears older than stated age Attitude/Demeanor/Rapport:  Other (pt appropriate) Affect (typically observed):  Appropriate Orientation:  Oriented to Self, Oriented to Place, Oriented to  Time, Oriented to Situation Alcohol / Substance use:  Not Applicable Psych involvement (Current and /or in the community):  No (Comment)  Discharge Needs  Concerns to be addressed:  Discharge Planning Concerns Readmission within the last 30 days:  No Current discharge risk:  None Barriers to Discharge:  Continued Medical Work up   Alison Murray A, LCSW 04/01/2015, 4:53 PM [phone]

## 2015-04-01 NOTE — Progress Notes (Signed)
1 Day Post-Op   Subjective: Patient reports that he is doing well. Denies pain, nausea, vomiting, severe bladder spasms. Foley catheter with light pink urine on very slow drip - no clots or debris. Tolerating regular food.   Objective: Vital signs in last 24 hours: Temp:  [97.4 F (36.3 C)-97.9 F (36.6 C)] 97.9 F (36.6 C) (11/18 0527) Pulse Rate:  [65-87] 67 (11/18 0527) Resp:  [9-16] 16 (11/18 0527) BP: (112-155)/(59-84) 112/59 mmHg (11/18 0527) SpO2:  [95 %-100 %] 96 % (11/18 0527) Weight:  [85.73 kg (189 lb)] 85.73 kg (189 lb) (11/17 1012)  Intake/Output from previous day: 11/17 0701 - 11/18 0700 In: 24862.5 [P.O.:120; I.V.:1942.5] Out: 28050 [Urine:28050] Intake/Output this shift:    Physical Exam:  General:alert and cooperative GI: soft, non tender, normal bowel sounds, no palpable masses, no organomegaly, no inguinal hernia Male genitalia: Foley catheter in place draining very light pink urine on very slow drip. No debris or clots. Extremities: extremities normal, atraumatic, no cyanosis or edema  Lab Results:  Recent Labs  03/31/15 1053 03/31/15 1435 04/01/15 0528  HGB 8.3* 8.3* 8.2*  HCT 26.3* 26.4* 25.4*   BMET  Recent Labs  03/31/15 1053  NA 140  K 4.7  CL 109  CO2 23  GLUCOSE 85  BUN 47*  CREATININE 1.43*  CALCIUM 9.0   No results for input(s): LABPT, INR in the last 72 hours. No results for input(s): LABURIN in the last 72 hours. Results for orders placed or performed during the hospital encounter of 08/14/11  Surgical pcr screen     Status: None   Collection Time: 08/14/11 10:05 AM  Result Value Ref Range Status   MRSA, PCR NEGATIVE NEGATIVE Final   Staphylococcus aureus NEGATIVE NEGATIVE Final    Comment:        The Xpert SA Assay (FDA approved for NASAL specimens only), is one component of a comprehensive surveillance program.  It is not intended to diagnose infection nor to guide or monitor treatment.    Studies/Results: No  results found.  Assessment/Plan: 81 yo M POD#1 TURP and prostate biopsy who is recovering well. Urine is very light pink on very slow drip. Hemoglobin stable. VSS. Excellent UOP.  - Stop CBI this morning - F/u urine later this morning - Plan to keep foley in place until Saturday AM at [place] perform TOV tomorrow prior to discharge - Continue regular diet - Medlocked - OOB to chair/wheelchair - SCDs     Acie Fredrickson 04/01/2015, 7:23 AM

## 2015-04-01 NOTE — NC FL2 (Signed)
[place] LEVEL OF CARE SCREENING TOOL     IDENTIFICATION  Patient Name: Devon Griffin Birthdate: 1929-03-25 Sex: male Admission Date (Current Location): 03/31/2015  [place] and [place] Number: [place] [id number] [place] and Address:  [facility],  [place] [address], [place]      Provider Number: [id number]  Attending Physician Name and Address:  Irine Seal, MD  Relative Name and Phone Number:       Current Level of Care: Hospital Recommended Level of Care: [place] Prior Approval Number:    Date Approved/Denied:   PASRR Number: [id number]  Discharge Plan: SNF    Current Diagnoses: Patient Active Problem List   Diagnosis Date Noted  . Benign prostatic hypertrophy with urinary retention 03/31/2015  . Renal failure 12/24/2014  . Urinary obstruction 12/24/2014  . AKI (acute kidney injury) ([place]) 12/24/2014  . Benign localized hyperplasia of prostate with urinary retention 12/24/2014  . Hypertension   . Essential hypertension   . S/P total knee replacement 09/03/2011  . Anemia 06/19/2011  . OA (osteoarthritis) of knee 06/06/2011    Orientation ACTIVITIES/SOCIAL BLADDER RESPIRATION    Self, Time, Situation, Place  Active Indwelling catheter (Indwelling catherter to be removed prior to d/c) Normal  BEHAVIORAL SYMPTOMS/MOOD NEUROLOGICAL BOWEL NUTRITION STATUS      Continent Diet (Regular)  PHYSICIAN VISITS COMMUNICATION OF NEEDS Height & Weight Skin    Verbally 6\' 1"  (185.4 cm) 189 lbs. Normal          AMBULATORY STATUS RESPIRATION     (+1 assist) Normal      Personal Care Assistance Level of Assistance  Bathing, Dressing, Feeding Bathing Assistance: Limited assistance Feeding assistance: Independent Dressing Assistance: Limited assistance      Functional Limitations Info  Sight, Hearing, Speech Sight Info: Adequate Hearing Info: Impaired Speech Info: Adequate       SPECIAL CARE FACTORS  FREQUENCY                      Additional Factors Info  Code Status, Allergies Code Status Info: FULL code status Allergies Info: No Active Allergies           Current Medications (04/01/2015): Current Facility-Administered Medications  Medication Dose Route Frequency Provider Last Rate Last Dose  . acetaminophen (TYLENOL) tablet 650 mg  650 mg Oral Q6H PRN Irine Seal, MD      . bisacodyl (DULCOLAX) suppository 10 mg  10 mg Rectal Daily PRN Irine Seal, MD      . cefTRIAXone (ROCEPHIN) 1 g in dextrose 5 % 50 mL IVPB  1 g Intravenous Q24H Irine Seal, MD   1 g at 04/01/15 1326  . dextrose 5 % and 0.45 % NaCl with KCl 20 mEq/L infusion   Intravenous Continuous Irine Seal, MD 75 mL/hr at 04/01/15 0556    . diphenhydrAMINE (BENADRYL) injection 12.5 mg  12.5 mg Intravenous Q6H PRN Irine Seal, MD       Or  . diphenhydrAMINE (BENADRYL) 12.5 MG/5ML elixir 12.5 mg  12.5 mg Oral Q6H PRN Irine Seal, MD      . docusate sodium (COLACE) capsule 100 mg  100 mg Oral BID Irine Seal, MD   100 mg at 04/01/15 1021  . ferrous sulfate tablet 325 mg  325 mg Oral Daily Irine Seal, MD   325 mg at 04/01/15 1021  . furosemide (LASIX) tablet 20 mg  20 mg Oral Daily Irine Seal, MD   20 mg at  04/01/15 1021  . HYDROcodone-acetaminophen (NORCO/VICODIN) 5-325 MG per tablet 1-2 tablet  1-2 tablet Oral Q4H PRN Irine Seal, MD      . hyoscyamine (LEVSIN SL) SL tablet 0.125 mg  0.125 mg Sublingual Q4H PRN Irine Seal, MD      . lisinopril (PRINIVIL,ZESTRIL) tablet 20 mg  20 mg Oral Daily Irine Seal, MD   20 mg at 04/01/15 1021  . magnesium hydroxide (MILK OF MAGNESIA) suspension 30 mL  30 mL Oral Daily PRN Irine Seal, MD      . multivitamin with minerals tablet 1 tablet  1 tablet Oral Daily Irine Seal, MD   1 tablet at 04/01/15 1021  . ondansetron (ZOFRAN) injection 4 mg  4 mg Intravenous Q4H PRN Irine Seal, MD      . polyethylene glycol (MIRALAX / GLYCOLAX) packet 17 g  17 g Oral Daily PRN Irine Seal, MD      .  zolpidem (AMBIEN) tablet 5 mg  5 mg Oral QHS PRN Irine Seal, MD       Do not use this list as official medication orders. Please verify with discharge summary.  Discharge Medications:   Medication List    STOP taking these medications        tamsulosin 0.4 MG Caps capsule  Commonly known as:  FLOMAX      TAKE these medications        acetaminophen 325 MG tablet  Commonly known as:  TYLENOL  Take 650 mg by mouth every 6 (six) hours as needed (pain).     docusate sodium 100 MG capsule  Commonly known as:  COLACE  Take 100 mg by mouth 2 (two) times daily.     docusate sodium 100 MG capsule  Commonly known as:  COLACE  Take 1 capsule (100 mg total) by mouth 2 (two) times daily.     ferrous sulfate 325 (65 FE) MG tablet  Take 325 mg by mouth daily.     furosemide 20 MG tablet  Commonly known as:  LASIX  Take 20 mg by mouth daily.     lisinopril 20 MG tablet  Commonly known as:  PRINIVIL,ZESTRIL  Take 20 mg by mouth daily.     multivitamin with minerals Tabs tablet  Take 1 tablet by mouth daily.     polyethylene glycol packet  Commonly known as:  MIRALAX / GLYCOLAX  Take 17 g by mouth daily as needed (constipation). Mix in 8oz of fluids.     sodium phosphate enema  Commonly known as:  FLEET  Place 1 enema rectally once. follow package directions     sodium polystyrene powder  Commonly known as:  KAYEXALATE  Take by mouth once.     traMADol 50 MG tablet  Commonly known as:  ULTRAM  Take 1 tablet (50 mg total) by mouth every 6 (six) hours as needed.        Relevant Imaging Results:  Relevant Lab Results:  Recent Labs    Additional Information SSN: [id number]  Meekah Math A, LCSW

## 2015-04-02 DIAGNOSIS — N401 Enlarged prostate with lower urinary tract symptoms: Secondary | ICD-10-CM | POA: Diagnosis not present

## 2015-04-02 NOTE — Progress Notes (Signed)
Pt ready for d/c today. Pt / niece are in agreement with d/c to [facility].. Family requesting to transport pt to SNF. D/C Summary sent to SNF prior to d/c for review. Scripts included in d/c packet. D/C packet provided to pt / family prior to d/c.  Werner Lean LCSW [phone]

## 2015-04-02 NOTE — NC FL2 (Signed)
[place] LEVEL OF CARE SCREENING TOOL     IDENTIFICATION  Patient Name: Devon Griffin Birthdate: September 10, 1928 Sex: male Admission Date (Current Location): 03/31/2015  [place] and [place] Number: [place] [id number] [place] and Address:  [facility],  [place] [address] [place]      Provider Number: [id number]  Attending Physician Name and Address:  Irine Seal, MD  Relative Name and Phone Number:       Current Level of Care: Hospital Recommended Level of Care: [place] Prior Approval Number:    Date Approved/Denied:   PASRR Number: [id number]  Discharge Plan: SNF    Current Diagnoses: Patient Active Problem List   Diagnosis Date Noted  . Benign prostatic hypertrophy with urinary retention 03/31/2015  . Renal failure 12/24/2014  . Urinary obstruction 12/24/2014  . AKI (acute kidney injury) ([place]) 12/24/2014  . Benign localized hyperplasia of prostate with urinary retention 12/24/2014  . Hypertension   . Essential hypertension   . S/P total knee replacement 09/03/2011  . Anemia 06/19/2011  . OA (osteoarthritis) of knee 06/06/2011    Orientation ACTIVITIES/SOCIAL BLADDER RESPIRATION    Self, Time, Situation, Place  Active Indwelling catheter (Indwelling catherter to be removed prior to d/c) Normal  BEHAVIORAL SYMPTOMS/MOOD NEUROLOGICAL BOWEL NUTRITION STATUS      Continent Diet (Regular)  PHYSICIAN VISITS COMMUNICATION OF NEEDS Height & Weight Skin    Verbally 6\' 1"  (185.4 cm) 189 lbs. Normal          AMBULATORY STATUS RESPIRATION     (+1 assist) Normal      Personal Care Assistance Level of Assistance  Bathing, Dressing, Feeding Bathing Assistance: Limited assistance Feeding assistance: Independent Dressing Assistance: Limited assistance      Functional Limitations Info  Sight, Hearing, Speech Sight Info: Adequate Hearing Info: Impaired Speech Info: Adequate       SPECIAL CARE FACTORS  FREQUENCY                      Additional Factors Info  Code Status, Allergies Code Status Info: FULL code status Allergies Info: No Active Allergies           Current Medications (04/02/2015): Current Facility-Administered Medications  Medication Dose Route Frequency Provider Last Rate Last Dose  . acetaminophen (TYLENOL) tablet 650 mg  650 mg Oral Q6H PRN Irine Seal, MD      . bisacodyl (DULCOLAX) suppository 10 mg  10 mg Rectal Daily PRN Irine Seal, MD      . cefTRIAXone (ROCEPHIN) 1 g in dextrose 5 % 50 mL IVPB  1 g Intravenous Q24H Irine Seal, MD   1 g at 04/02/15 1203  . dextrose 5 % and 0.45 % NaCl with KCl 20 mEq/L infusion   Intravenous Continuous Irine Seal, MD 75 mL/hr at 04/01/15 1944    . diphenhydrAMINE (BENADRYL) injection 12.5 mg  12.5 mg Intravenous Q6H PRN Irine Seal, MD       Or  . diphenhydrAMINE (BENADRYL) 12.5 MG/5ML elixir 12.5 mg  12.5 mg Oral Q6H PRN Irine Seal, MD      . docusate sodium (COLACE) capsule 100 mg  100 mg Oral BID Irine Seal, MD   100 mg at 04/02/15 0912  . ferrous sulfate tablet 325 mg  325 mg Oral Daily Irine Seal, MD   325 mg at 04/02/15 0912  . furosemide (LASIX) tablet 20 mg  20 mg Oral Daily Irine Seal, MD   20 mg at  04/02/15 0912  . HYDROcodone-acetaminophen (NORCO/VICODIN) 5-325 MG per tablet 1-2 tablet  1-2 tablet Oral Q4H PRN Irine Seal, MD      . hyoscyamine (LEVSIN SL) SL tablet 0.125 mg  0.125 mg Sublingual Q4H PRN Irine Seal, MD      . lisinopril (PRINIVIL,ZESTRIL) tablet 20 mg  20 mg Oral Daily Irine Seal, MD   20 mg at 04/02/15 0912  . magnesium hydroxide (MILK OF MAGNESIA) suspension 30 mL  30 mL Oral Daily PRN Irine Seal, MD      . multivitamin with minerals tablet 1 tablet  1 tablet Oral Daily Irine Seal, MD   1 tablet at 04/02/15 0912  . ondansetron (ZOFRAN) injection 4 mg  4 mg Intravenous Q4H PRN Irine Seal, MD      . polyethylene glycol (MIRALAX / GLYCOLAX) packet 17 g  17 g Oral Daily PRN Irine Seal, MD      .  zolpidem (AMBIEN) tablet 5 mg  5 mg Oral QHS PRN Irine Seal, MD       Do not use this list as official medication orders. Please verify with discharge summary.  Discharge Medications:   Medication List    STOP taking these medications        tamsulosin 0.4 MG Caps capsule  Commonly known as:  FLOMAX      TAKE these medications        acetaminophen 325 MG tablet  Commonly known as:  TYLENOL  Take 650 mg by mouth every 6 (six) hours as needed (pain).     docusate sodium 100 MG capsule  Commonly known as:  COLACE  Take 100 mg by mouth 2 (two) times daily.     docusate sodium 100 MG capsule  Commonly known as:  COLACE  Take 1 capsule (100 mg total) by mouth 2 (two) times daily.     ferrous sulfate 325 (65 FE) MG tablet  Take 325 mg by mouth daily.     furosemide 20 MG tablet  Commonly known as:  LASIX  Take 20 mg by mouth daily.     lisinopril 20 MG tablet  Commonly known as:  PRINIVIL,ZESTRIL  Take 20 mg by mouth daily.     multivitamin with minerals Tabs tablet  Take 1 tablet by mouth daily.     polyethylene glycol packet  Commonly known as:  MIRALAX / GLYCOLAX  Take 17 g by mouth daily as needed (constipation). Mix in 8oz of fluids.     sodium phosphate enema  Commonly known as:  FLEET  Place 1 enema rectally once. follow package directions     sodium polystyrene powder  Commonly known as:  KAYEXALATE  Take by mouth once.     traMADol 50 MG tablet  Commonly known as:  ULTRAM  Take 1 tablet (50 mg total) by mouth every 6 (six) hours as needed.        Relevant Imaging Results:  Relevant Lab Results:  Recent Labs    Additional Information SSN: [id number]  Mays Paino, Randall An, LCSW

## 2015-04-02 NOTE — Discharge Summary (Signed)
Physician Discharge Summary  Patient ID: Devon Griffin MRN: [id number] DOB/AGE: [birth year] 81 y.o.  Admit date: 03/31/2015 Discharge date: 04/02/2015  Admission Diagnoses: Prostatic Hypertrophy with Urinary Obstruction   Discharge Diagnoses:  Active Problems:   Benign prostatic hypertrophy with urinary retention   Discharged Condition: good  Hospital Course:   1 - Prostatic Hypertrophy with Urinary Obstruction  - Pt underwent transurethral resection of prostate on 03/31/15, the day of admission, without acute complications. Managed initially with saline bladder irrigation that was weaned to off. By POD 2, the day of discharge, his catheter was removed and he is able to void to completion x several, pain controlled with PO meds, maintaining PO nutrition, and felt to be adequate for discharge. Final path pending at discharge.   Consults: None  Significant Diagnostic Studies: labs: surgical pathology -pending, Hgb 8.2 and stable  Treatments:  transurethral resection of prostate on 03/31/15  Discharge Exam: Blood pressure 136/78, pulse 78, temperature 97.4 F (36.3 C), temperature source Oral, resp. rate 16, height 6\' 1"  (1.854 m), weight 85.73 kg (189 lb), SpO2 97 %. General appearance: alert, cooperative and appears stated age Head: Normocephalic, without obvious abnormality, atraumatic Eyes: negative Nose: Nares normal. Septum midline. Mucosa normal. No drainage or sinus tenderness. Throat: lips, mucosa, and tongue normal; teeth and gums normal Neck: supple, symmetrical, trachea midline Back: symmetric, no curvature. ROM normal. No CVA tenderness. Resp: non-labored on room air Cardio: Nl rate GI: soft, non-tender; bowel sounds normal; no masses,  no organomegaly Male genitalia: normal, foley now out. Bedisde racked urine light pink w/o clots.  Extremities: extremities normal, atraumatic, no cyanosis or edema Pulses: 2+ and symmetric Skin: Skin color, texture, turgor  normal. No rashes or lesions Lymph nodes: Cervical, supraclavicular, and axillary nodes normal. Neurologic: Grossly normal  Disposition: 01-Home or Self Care  Discharge Instructions    Call MD for:  difficulty breathing, headache or visual disturbances    Complete by:  As directed      Call MD for:  extreme fatigue    Complete by:  As directed      Call MD for:  hives    Complete by:  As directed      Call MD for:  persistant dizziness or light-headedness    Complete by:  As directed      Call MD for:  persistant nausea and vomiting    Complete by:  As directed      Call MD for:  redness, tenderness, or signs of infection (pain, swelling, redness, odor or green/yellow discharge around incision site)    Complete by:  As directed      Call MD for:  severe uncontrolled pain    Complete by:  As directed      Call MD for:  temperature >100.4    Complete by:  As directed      Diet general    Complete by:  As directed      Discharge instructions    Complete by:  As directed   Transurethral Resection of Prostate (TURP)   Definition:  Transurethral Resection of the Prostate is a surgical procedure used to improve urination due to prostate obstruction.   General instructions:  Your recent prostate surgery requires very little post hospital care but some definite precautions.   Despite the fact that no skin incisions were used, the area around the prostate resection site are raw and covered with scabs to promote healing and prevent bleeding. Certain precautions are needed to  insure that the scabs are not disturbed over the next 2-4 weeks while the healing proceeds.   Because the raw surface inside your prostate and the irritating effects of urine you may expect frequency of urination and/or urgency (a stronger desire to urinate) and perhaps even getting up at night more often. This will usually resolve or improve slowly over the healing period. You may see some blood in your urine over the  first 6 weeks. Do not be alarmed, even if the urine was clear for a while. Get off your feet and drink lots of fluids until clearing occurs. If your urine becomes thick like ketchup or you start to pass clots or don't improve call us.   Diet:  You may return to your normal diet immediately. Because of the raw surface of your prostate, alcohol, spicy foods, foods high in acid and drinks with caffeine may cause irritation or frequency and should be used in moderation. To keep your urine flowing freely and avoid constipation, drink plenty of fluids during the day (8-10 glasses). Tip: Avoid cranberry juice because it is very acidic.   Activity:  Your physical activity doesn't need to be restricted. However, if you are very active, you may see some blood in the urine. We suggest that you reduce your activity under the circumstances until the bleeding has stopped.   Bowels:  It is important to keep your bowels regular during the postoperative period. Straining with bowel movements can cause bleeding. A bowel movement every other day is reasonable. Use a mild laxative if needed, such as milk of magnesia 2-3 tablespoons, or 2 Dulcolax tablets. Call if you continue to have problems. If you had been taking narcotics for pain, before, during or after your surgery, you may be constipated. Take a laxative if necessary.   Medication:  You should resume your pre-surgery medications unless told not to. In addition you may be given an antibiotic to prevent or treat infection. Antibiotics are not always necessary. All medication should be taken as prescribed until the bottles are finished unless you are having an unusual reaction to one of the drugs.   General Anesthetic, Adult: A doctor specialized in giving anesthesia (anesthesiologist) or a nurse specialized in giving anesthesia (nurse anesthetist) gives medicine that makes you sleep while a procedure is performed (general anesthetic). Once the general anesthetic  has been administered, you will be in a sleeplike state in which you feel no pain. After having a general anesthetic you may feel:  Dizzy.  Weak.  Drowsy.  Confused.  These feelings are normal and can be expected to last for up to 24 hours after the procedure.  LET YOUR CAREGIVER KNOW ABOUT:  Allergies you have.  Medications you are taking, including herbs, eye drops, over the counter medications, dietary supplements, and creams.  Previous problems you have had with anesthetics or numbing medicines.  Use of cigarettes, alcohol, or illicit drugs.  Possibility of pregnancy, if this applies.  History of bleeding or blood disorders, including blood clots and clotting disorders.  Previous surgeries you have had and types of anesthetics you have received.  Family medical history, especially anesthetic problems.  Other health problems.   SEEK IMMEDIATE MEDICAL CARE IF:  You develop a rash.  You have difficulty breathing.  You have chest pain.  You have allergic problems.  You have uncontrolled nausea.  You have uncontrolled vomiting.  You develop any serious bleeding, especially from the incision site.     Increase activity slowly  Complete by:  As directed             Medication List    STOP taking these medications        tamsulosin 0.4 MG Caps capsule  Commonly known as:  FLOMAX      TAKE these medications        acetaminophen 325 MG tablet  Commonly known as:  TYLENOL  Take 650 mg by mouth every 6 (six) hours as needed (pain).     docusate sodium 100 MG capsule  Commonly known as:  COLACE  Take 100 mg by mouth 2 (two) times daily.     docusate sodium 100 MG capsule  Commonly known as:  COLACE  Take 1 capsule (100 mg total) by mouth 2 (two) times daily.     ferrous sulfate 325 (65 FE) MG tablet  Take 325 mg by mouth daily.     furosemide 20 MG tablet  Commonly known as:  LASIX  Take 20 mg by mouth daily.     lisinopril 20 MG tablet  Commonly known as:   PRINIVIL,ZESTRIL  Take 20 mg by mouth daily.     multivitamin with minerals Tabs tablet  Take 1 tablet by mouth daily.     polyethylene glycol packet  Commonly known as:  MIRALAX / GLYCOLAX  Take 17 g by mouth daily as needed (constipation). Mix in 8oz of fluids.     sodium phosphate enema  Commonly known as:  FLEET  Place 1 enema rectally once. follow package directions     sodium polystyrene powder  Commonly known as:  KAYEXALATE  Take by mouth once.     traMADol 50 MG tablet  Commonly known as:  ULTRAM  Take 1 tablet (50 mg total) by mouth every 6 (six) hours as needed.           Follow-up Information    Follow up with Malka So, MD On 04/22/2015.   Specialty:  Urology   Why:  [id number]   Contact information:   [address] [place] [address] [phone]       Signed: Alexis Frock 04/02/2015, 2:04 PM

## 2015-04-02 NOTE — Progress Notes (Signed)
This nurse attempted to call report to receiving facility, [facility] in [place] four times. All attempts resulted in continuous phone ringing without answer. Reported this to on-coming nurse.

## 2015-04-04 NOTE — Anesthesia Postprocedure Evaluation (Signed)
Anesthesia Post Note  Patient: Devon Griffin  Procedure(s) Performed: Procedure(s) (LRB): TRANSURETHRAL RESECTION OF THE PROSTATE (TURP) (N/A) PROSTATE ULTRASOUND AND BIOPSY (N/A)  Vital Signs Assessment: post-procedure vital signs reviewed and stable    Last Vitals:  Filed Vitals:   04/02/15 0436 04/02/15 1348  BP: 118/57 136/78  Pulse: 65 78  Temp: 36.9 C 36.3 C  Resp: 16 16    Last Pain:  Filed Vitals:   04/02/15 1349  PainSc: 0-No pain                 EDWARDS,Samya Siciliano

## 2015-04-05 ENCOUNTER — Other Ambulatory Visit: Payer: Self-pay | Admitting: Urology

## 2015-04-05 DIAGNOSIS — C61 Malignant neoplasm of prostate: Secondary | ICD-10-CM

## 2015-04-06 NOTE — Addendum Note (Signed)
Addendum  created 04/06/15 1040 by Finis Bud, MD   Modules edited: Clinical Notes   Clinical Notes:  File: [id number]

## 2015-04-06 NOTE — Anesthesia Postprocedure Evaluation (Signed)
Anesthesia Post Note  Patient: Devon Griffin  Procedure(s) Performed: Procedure(s) (LRB): TRANSURETHRAL RESECTION OF THE PROSTATE (TURP) (N/A) PROSTATE ULTRASOUND AND BIOPSY (N/A)  Patient location during evaluation: PACU Anesthesia Type: General Level of consciousness: awake Respiratory status: spontaneous breathing Cardiovascular status: stable Anesthetic complications: no    Last Vitals:  Filed Vitals:   04/02/15 0436 04/02/15 1348  BP: 118/57 136/78  Pulse: 65 78  Temp: 36.9 C 36.3 C  Resp: 16 16    Last Pain:  Filed Vitals:   04/02/15 1349  PainSc: 0-No pain                 EDWARDS,Delquan Poucher

## 2015-04-14 ENCOUNTER — Encounter (HOSPITAL_COMMUNITY)
Admission: RE | Admit: 2015-04-14 | Discharge: 2015-04-14 | Disposition: A | Discharge status: Home / Self Care | Payer: Medicare Other | Source: Ambulatory Visit | Attending: Urology | Admitting: Urology

## 2015-04-14 ENCOUNTER — Encounter (HOSPITAL_COMMUNITY): Payer: Self-pay

## 2015-04-14 DIAGNOSIS — C61 Malignant neoplasm of prostate: Secondary | ICD-10-CM | POA: Insufficient documentation

## 2015-04-14 MED ORDER — TECHNETIUM TC 99M MEDRONATE IV KIT
25.0000 | PACK | Freq: Once | INTRAVENOUS | Status: AC | PRN
  Administered 2015-04-14: 27 via INTRAVENOUS

## 2015-04-22 ENCOUNTER — Ambulatory Visit (INDEPENDENT_AMBULATORY_CARE_PROVIDER_SITE_OTHER): Payer: Self-pay | Admitting: Urology

## 2015-04-22 DIAGNOSIS — N401 Enlarged prostate with lower urinary tract symptoms: Secondary | ICD-10-CM

## 2015-04-22 DIAGNOSIS — C61 Malignant neoplasm of prostate: Secondary | ICD-10-CM

## 2015-04-22 DIAGNOSIS — R338 Other retention of urine: Secondary | ICD-10-CM

## 2015-04-22 DIAGNOSIS — N138 Other obstructive and reflux uropathy: Secondary | ICD-10-CM

## 2015-07-22 ENCOUNTER — Ambulatory Visit (INDEPENDENT_AMBULATORY_CARE_PROVIDER_SITE_OTHER): Payer: Medicare Other | Admitting: Urology

## 2015-07-22 DIAGNOSIS — R3915 Urgency of urination: Secondary | ICD-10-CM | POA: Diagnosis not present

## 2015-07-22 DIAGNOSIS — C61 Malignant neoplasm of prostate: Secondary | ICD-10-CM

## 2015-07-22 DIAGNOSIS — N401 Enlarged prostate with lower urinary tract symptoms: Secondary | ICD-10-CM

## 2015-12-16 ENCOUNTER — Ambulatory Visit (INDEPENDENT_AMBULATORY_CARE_PROVIDER_SITE_OTHER): Payer: Medicare Other | Admitting: Urology

## 2015-12-16 DIAGNOSIS — R972 Elevated prostate specific antigen [PSA]: Secondary | ICD-10-CM | POA: Diagnosis not present

## 2015-12-16 DIAGNOSIS — N401 Enlarged prostate with lower urinary tract symptoms: Secondary | ICD-10-CM

## 2015-12-16 DIAGNOSIS — C61 Malignant neoplasm of prostate: Secondary | ICD-10-CM

## 2016-07-13 ENCOUNTER — Ambulatory Visit (INDEPENDENT_AMBULATORY_CARE_PROVIDER_SITE_OTHER): Payer: Medicare Other | Admitting: Urology

## 2016-07-13 DIAGNOSIS — R972 Elevated prostate specific antigen [PSA]: Secondary | ICD-10-CM | POA: Diagnosis not present

## 2016-07-13 DIAGNOSIS — C61 Malignant neoplasm of prostate: Secondary | ICD-10-CM

## 2017-02-08 ENCOUNTER — Ambulatory Visit: Payer: Medicare Other | Admitting: Urology

## 2017-05-03 ENCOUNTER — Ambulatory Visit (INDEPENDENT_AMBULATORY_CARE_PROVIDER_SITE_OTHER): Payer: Medicare Other | Admitting: Urology

## 2017-05-03 DIAGNOSIS — R972 Elevated prostate specific antigen [PSA]: Secondary | ICD-10-CM | POA: Diagnosis not present

## 2017-05-03 DIAGNOSIS — C61 Malignant neoplasm of prostate: Secondary | ICD-10-CM | POA: Diagnosis not present

## 2017-11-01 ENCOUNTER — Ambulatory Visit (INDEPENDENT_AMBULATORY_CARE_PROVIDER_SITE_OTHER): Payer: Medicare Other | Admitting: Urology

## 2017-11-01 DIAGNOSIS — C61 Malignant neoplasm of prostate: Secondary | ICD-10-CM | POA: Diagnosis not present

## 2017-11-01 DIAGNOSIS — R972 Elevated prostate specific antigen [PSA]: Secondary | ICD-10-CM | POA: Diagnosis not present

## 2017-11-05 ENCOUNTER — Other Ambulatory Visit: Payer: Self-pay | Admitting: Urology

## 2017-11-05 DIAGNOSIS — R972 Elevated prostate specific antigen [PSA]: Secondary | ICD-10-CM

## 2017-11-05 DIAGNOSIS — C61 Malignant neoplasm of prostate: Secondary | ICD-10-CM

## 2017-11-07 ENCOUNTER — Encounter (HOSPITAL_COMMUNITY): Payer: Medicare Other

## 2017-11-12 ENCOUNTER — Encounter (HOSPITAL_COMMUNITY): Payer: Self-pay

## 2017-11-12 ENCOUNTER — Encounter (HOSPITAL_COMMUNITY)
Admission: RE | Admit: 2017-11-12 | Discharge: 2017-11-12 | Disposition: A | Discharge status: Home / Self Care | Payer: Medicare Other | Source: Ambulatory Visit | Attending: Urology | Admitting: Urology

## 2017-11-12 DIAGNOSIS — C61 Malignant neoplasm of prostate: Secondary | ICD-10-CM | POA: Insufficient documentation

## 2017-11-12 DIAGNOSIS — R972 Elevated prostate specific antigen [PSA]: Secondary | ICD-10-CM | POA: Insufficient documentation

## 2017-11-12 HISTORY — DX: Malignant (primary) neoplasm, unspecified: C80.1

## 2017-11-12 MED ORDER — TECHNETIUM TC 99M MEDRONATE IV KIT
20.0000 | PACK | Freq: Once | INTRAVENOUS | Status: AC | PRN
  Administered 2017-11-12: 20.5 via INTRAVENOUS

## 2017-11-26 ENCOUNTER — Other Ambulatory Visit: Payer: Self-pay | Admitting: Urology

## 2017-11-26 DIAGNOSIS — R972 Elevated prostate specific antigen [PSA]: Secondary | ICD-10-CM

## 2017-11-26 DIAGNOSIS — N130 Hydronephrosis with ureteropelvic junction obstruction: Secondary | ICD-10-CM

## 2017-11-26 DIAGNOSIS — C61 Malignant neoplasm of prostate: Secondary | ICD-10-CM

## 2017-12-20 ENCOUNTER — Ambulatory Visit (HOSPITAL_COMMUNITY): Payer: Medicare Other

## 2018-01-02 ENCOUNTER — Ambulatory Visit (HOSPITAL_COMMUNITY)
Admission: RE | Admit: 2018-01-02 | Discharge: 2018-01-02 | Disposition: A | Discharge status: Home / Self Care | Payer: Medicare Other | Source: Ambulatory Visit | Attending: Urology | Admitting: Urology

## 2018-01-02 DIAGNOSIS — I517 Cardiomegaly: Secondary | ICD-10-CM | POA: Insufficient documentation

## 2018-01-02 DIAGNOSIS — N130 Hydronephrosis with ureteropelvic junction obstruction: Secondary | ICD-10-CM

## 2018-01-02 DIAGNOSIS — I700 Atherosclerosis of aorta: Secondary | ICD-10-CM | POA: Insufficient documentation

## 2018-01-02 DIAGNOSIS — M899 Disorder of bone, unspecified: Secondary | ICD-10-CM | POA: Insufficient documentation

## 2018-01-02 DIAGNOSIS — N1330 Unspecified hydronephrosis: Secondary | ICD-10-CM | POA: Diagnosis not present

## 2018-01-02 DIAGNOSIS — D7389 Other diseases of spleen: Secondary | ICD-10-CM | POA: Diagnosis not present

## 2018-01-02 DIAGNOSIS — I2510 Atherosclerotic heart disease of native coronary artery without angina pectoris: Secondary | ICD-10-CM | POA: Diagnosis not present

## 2018-01-02 DIAGNOSIS — C61 Malignant neoplasm of prostate: Secondary | ICD-10-CM | POA: Diagnosis not present

## 2018-01-02 DIAGNOSIS — R972 Elevated prostate specific antigen [PSA]: Secondary | ICD-10-CM

## 2018-01-02 LAB — POCT I-STAT CREATININE: Creatinine, Ser: 1.3 mg/dL — ABNORMAL HIGH (ref 0.61–1.24)

## 2018-01-02 MED ORDER — IOPAMIDOL (ISOVUE-300) INJECTION 61%
100.0000 mL | Freq: Once | INTRAVENOUS | Status: AC | PRN
  Administered 2018-01-02: 100 mL via INTRAVENOUS

## 2018-04-18 ENCOUNTER — Ambulatory Visit (INDEPENDENT_AMBULATORY_CARE_PROVIDER_SITE_OTHER): Payer: Medicare Other | Admitting: Urology

## 2018-04-18 DIAGNOSIS — C61 Malignant neoplasm of prostate: Secondary | ICD-10-CM | POA: Diagnosis not present

## 2018-04-18 DIAGNOSIS — R351 Nocturia: Secondary | ICD-10-CM | POA: Diagnosis not present

## 2018-04-18 DIAGNOSIS — N130 Hydronephrosis with ureteropelvic junction obstruction: Secondary | ICD-10-CM | POA: Diagnosis not present

## 2018-04-18 DIAGNOSIS — R972 Elevated prostate specific antigen [PSA]: Secondary | ICD-10-CM | POA: Diagnosis not present

## 2018-10-24 ENCOUNTER — Ambulatory Visit: Payer: Medicare Other | Admitting: Urology

## 2019-01-23 ENCOUNTER — Ambulatory Visit (INDEPENDENT_AMBULATORY_CARE_PROVIDER_SITE_OTHER): Payer: Medicare Other | Admitting: Urology

## 2019-01-23 ENCOUNTER — Other Ambulatory Visit: Payer: Self-pay

## 2019-01-23 DIAGNOSIS — N401 Enlarged prostate with lower urinary tract symptoms: Secondary | ICD-10-CM

## 2019-01-23 DIAGNOSIS — C61 Malignant neoplasm of prostate: Secondary | ICD-10-CM | POA: Diagnosis not present

## 2019-01-23 DIAGNOSIS — R351 Nocturia: Secondary | ICD-10-CM

## 2019-01-23 DIAGNOSIS — R972 Elevated prostate specific antigen [PSA]: Secondary | ICD-10-CM | POA: Diagnosis not present

## 2019-04-24 ENCOUNTER — Other Ambulatory Visit: Payer: Self-pay

## 2019-04-24 ENCOUNTER — Ambulatory Visit (INDEPENDENT_AMBULATORY_CARE_PROVIDER_SITE_OTHER): Payer: Medicare Other | Admitting: Urology

## 2019-04-24 DIAGNOSIS — R972 Elevated prostate specific antigen [PSA]: Secondary | ICD-10-CM | POA: Diagnosis not present

## 2019-04-24 DIAGNOSIS — C61 Malignant neoplasm of prostate: Secondary | ICD-10-CM

## 2019-04-24 DIAGNOSIS — N401 Enlarged prostate with lower urinary tract symptoms: Secondary | ICD-10-CM

## 2019-04-24 DIAGNOSIS — R351 Nocturia: Secondary | ICD-10-CM

## 2019-05-25 ENCOUNTER — Other Ambulatory Visit: Payer: Self-pay

## 2019-05-25 DIAGNOSIS — C61 Malignant neoplasm of prostate: Secondary | ICD-10-CM

## 2019-10-23 ENCOUNTER — Ambulatory Visit: Payer: Medicare Other | Admitting: Urology

## 2019-10-30 ENCOUNTER — Ambulatory Visit: Payer: Medicare Other | Admitting: Urology

## 2019-11-19 DIAGNOSIS — C61 Malignant neoplasm of prostate: Secondary | ICD-10-CM | POA: Insufficient documentation

## 2019-11-19 DIAGNOSIS — R351 Nocturia: Secondary | ICD-10-CM | POA: Insufficient documentation

## 2019-11-19 NOTE — Progress Notes (Signed)
Subjective:  1. BPH with urinary obstruction   2. Prostate cancer ([place])   3. Elevated PSA   4. Nocturia      I have prostate cancer. HPI: Devon Griffin is a 86 year-old male established patient who is here evaluation for treatment of prostate cancer.  His PSA at his time of diagnosis was 12.6. His most recent PSA is 20.89.   He has not undergone surgery for treatment. He has not undergone External Beam Radiation Therapy for treatment. He has not undergone Hormonal Therapy for treatment.   He does not have urinary incontinence. He has not recently had unwanted weight loss. He is not having pain in new locations.   Devon Griffin returns in f/u for his history of prostate cancer diagnosed by TURP and prostate biopsy on 04/03/15 which was done for retention and an elevated PSA for 12.6. He had Gleason 6 in 5% of the prostate chips and he had 4 cores positive with 3/4 having Gleason 7(3+4) in up to 60% of the cores on the prostate biopsy. His PSA had been rising but he didn't get one as ordered prior to this visit but it was only up to 20.89 prior to his last visit from 20.37 in 6/20, but it was 16.94 last year. He had a B[place] and CT that showed no mets in 8/19, but he does have chronic left hydro. He is doing well with voiding but has nocturia 3-4x.  He has been started on tamsulosin.  He has no hematuria.  He has no incontinence. He has had no bone pain or further weight loss. He has no other associated signs or symptoms.       ROS:  ROS:  A complete review of systems was performed.  All systems are negative except for pertinent findings as noted.   ROS  Allergies  Allergen Reactions  . Other     Outpatient Encounter Medications as of 11/20/2019  Medication Sig  . acetaminophen (TYLENOL) 325 MG tablet Take 650 mg by mouth every 6 (six) hours as needed (pain).  . calcium carbonate (OS-CAL) 1250 (500 Ca) MG chewable tablet Chew 1 tablet by mouth daily.  . cholecalciferol (VITAMIN D3)  25 MCG (1000 UNIT) tablet Take 1,000 Units by mouth daily.  . tamsulosin (FLOMAX) 0.4 MG CAPS capsule Take 0.4 mg by mouth daily.  Marland Kitchen docusate sodium (COLACE) 100 MG capsule Take 100 mg by mouth 2 (two) times daily.   (Patient not taking: Reported on 11/20/2019)  . docusate sodium (COLACE) 100 MG capsule Take 1 capsule (100 mg total) by mouth 2 (two) times daily. (Patient not taking: Reported on 11/20/2019)  . ferrous sulfate 325 (65 FE) MG tablet Take 325 mg by mouth daily.  (Patient not taking: Reported on 11/20/2019)  . furosemide (LASIX) 20 MG tablet Take 20 mg by mouth daily. (Patient not taking: Reported on 11/20/2019)  . lisinopril (PRINIVIL,ZESTRIL) 20 MG tablet Take 20 mg by mouth daily. (Patient not taking: Reported on 11/20/2019)  . Multiple Vitamin (MULTIVITAMIN WITH MINERALS) TABS tablet Take 1 tablet by mouth daily. (Patient not taking: Reported on 11/20/2019)  . polyethylene glycol (MIRALAX / GLYCOLAX) packet Take 17 g by mouth daily as needed (constipation). Mix in 8oz of fluids. (Patient not taking: Reported on 11/20/2019)  . sodium phosphate (FLEET) enema Place 1 enema rectally once. follow package directions (Patient not taking: Reported on 11/20/2019)  . sodium polystyrene (KAYEXALATE) powder Take by mouth once. (Patient not taking: Reported on 11/20/2019)  .  traMADol (ULTRAM) 50 MG tablet Take 1 tablet (50 mg total) by mouth every 6 (six) hours as needed. (Patient not taking: Reported on 11/20/2019)   No facility-administered encounter medications on file as of 11/20/2019.    Past Medical History:  Diagnosis Date  . Anemia   . Anemia 06/19/2011  . Arthritis   . Cancer [facility])    Prostate  . Elevated prostate specific antigen (PSA)   . Hypertension   . Premature atrial contractions   . Renal insufficiency     Past Surgical History:  Procedure Laterality Date  . APPENDECTOMY    . HERNIA REPAIR     bilateral inguinal repair  . JOINT REPLACEMENT     right knee  . JOINT REPLACEMENT     left  knee  . PROSTATE BIOPSY N/A 03/31/2015   Procedure: PROSTATE ULTRASOUND AND BIOPSY;  Surgeon: Irine Seal, MD;  Location: [facility];  Service: Urology;  Laterality: N/A;  . TOTAL KNEE ARTHROPLASTY  08/20/2011   Procedure: TOTAL KNEE ARTHROPLASTY;  Surgeon: Carole Civil, MD;  Location: [facility];  Service: Orthopedics;  Laterality: Left;  [place]  . TRANSURETHRAL RESECTION OF PROSTATE N/A 03/31/2015   Procedure: TRANSURETHRAL RESECTION OF THE PROSTATE (TURP);  Surgeon: Irine Seal, MD;  Location: [facility];  Service: Urology;  Laterality: N/A;    Social History   Socioeconomic History  . Marital status: Married    Spouse name: Not on file  . Number of children: Not on file  . Years of education: Not on file  . Highest education level: Not on file  Occupational History  . Not on file  Tobacco Use  . Smoking status: Never Smoker  . Smokeless tobacco: Never Used  Substance and Sexual Activity  . Alcohol use: No  . Drug use: No  . Sexual activity: Not on file  Other Topics Concern  . Not on file  Social History Narrative  . Not on file   Social Determinants of Health   Financial Resource Strain:   . Difficulty of Paying Living Expenses:   Food Insecurity:   . Worried About Charity fundraiser in the Last Year:   . Arboriculturist in the Last Year:   Transportation Needs:   . Film/video editor (Medical):   Marland Kitchen Lack of Transportation (Non-Medical):   Physical Activity:   . Days of Exercise per Week:   . Minutes of Exercise per Session:   Stress:   . Feeling of Stress :   Social Connections:   . Frequency of Communication with Friends and Family:   . Frequency of Social Gatherings with Friends and Family:   . Attends Religious Services:   . Active Member of Clubs or Organizations:   . Attends Archivist Meetings:   Marland Kitchen Marital Status:   Intimate Partner Violence:   . Fear of Current or Ex-Partner:   . Emotionally Abused:   Marland Kitchen Physically Abused:   . Sexually Abused:      Family History  Problem Relation Age of Onset  . Arthritis Unknown   . Diabetes Mother   . Cancer Father        Objective: Vitals:   11/20/19 0914  BP: (!) 151/70  Pulse: 80  Temp: 99.6 F (37.6 C)     Physical Exam Lymphadenopathy:     Upper Body:     Left upper body: No supraclavicular adenopathy.        Studies/Results: No results found.  Assessment & Plan: Prostate cancer [facility]) He has no worrisome symptoms.  I will get a PSA today and in 6 months at f/u.   BPH with urinary obstruction He has stable voiding symptoms with nocturia on tamsulosin.   Nocturia Stable at 3-4x  Elevated PSA I will get the PSA today and prior to f/u.     No orders of the defined types were placed in this encounter.    Orders Placed This Encounter  Procedures  . PSA    Standing Status:   Future    Standing Expiration Date:   12/21/2019  . PSA    Standing Status:   Future    Standing Expiration Date:   11/19/2020  . POCT urinalysis dipstick      Return in about 6 months (around 05/22/2020) for With a PSA. Marland Kitchen   CC: Renata Caprice, DO      Irine Seal 11/20/2019

## 2019-11-20 ENCOUNTER — Encounter: Payer: Self-pay | Admitting: Urology

## 2019-11-20 ENCOUNTER — Ambulatory Visit (INDEPENDENT_AMBULATORY_CARE_PROVIDER_SITE_OTHER): Payer: Medicare Other | Admitting: Urology

## 2019-11-20 ENCOUNTER — Other Ambulatory Visit: Payer: Self-pay | Admitting: Urology

## 2019-11-20 ENCOUNTER — Other Ambulatory Visit: Payer: Self-pay

## 2019-11-20 VITALS — BP 151/70 | HR 80 | Temp 99.6°F | Ht 73.0 in | Wt 184.0 lb

## 2019-11-20 DIAGNOSIS — C61 Malignant neoplasm of prostate: Secondary | ICD-10-CM | POA: Diagnosis not present

## 2019-11-20 DIAGNOSIS — N401 Enlarged prostate with lower urinary tract symptoms: Secondary | ICD-10-CM | POA: Diagnosis not present

## 2019-11-20 DIAGNOSIS — R351 Nocturia: Secondary | ICD-10-CM

## 2019-11-20 DIAGNOSIS — N138 Other obstructive and reflux uropathy: Secondary | ICD-10-CM | POA: Diagnosis not present

## 2019-11-20 DIAGNOSIS — R972 Elevated prostate specific antigen [PSA]: Secondary | ICD-10-CM

## 2019-11-20 LAB — POCT URINALYSIS DIPSTICK
Bilirubin, UA: NEGATIVE
Blood, UA: NEGATIVE
Glucose, UA: NEGATIVE
Ketones, UA: NEGATIVE
Leukocytes, UA: NEGATIVE
Nitrite, UA: NEGATIVE
Protein, UA: NEGATIVE
Spec Grav, UA: 1.02
Urobilinogen, UA: NEGATIVE U/dL — AB
pH, UA: 7

## 2019-11-20 LAB — PSA: PSA: 18.8 ng/mL — ABNORMAL HIGH (ref <=4.0)

## 2019-11-20 NOTE — Assessment & Plan Note (Signed)
I will get the PSA today and prior to f/u.

## 2019-11-20 NOTE — Progress Notes (Signed)

## 2019-11-20 NOTE — Assessment & Plan Note (Signed)
Stable at 3-4x

## 2019-11-20 NOTE — Assessment & Plan Note (Signed)
He has no worrisome symptoms.  I will get a PSA today and in 6 months at f/u.

## 2019-11-20 NOTE — Assessment & Plan Note (Addendum)
He has stable voiding symptoms with nocturia on tamsulosin.

## 2019-12-22 IMAGING — DX DG RIBS 2V*R*
4 series · 4 of 4 positions shown · non-contrast
Comparison: None.

CLINICAL DATA: Possible abnormal increased activity within the
anterior RIGHT 8th rib on recent bone scan. History of prostate
cancer.

EXAM:
RIGHT RIBS - 2 VIEW

[rib pa (1 of 2)]
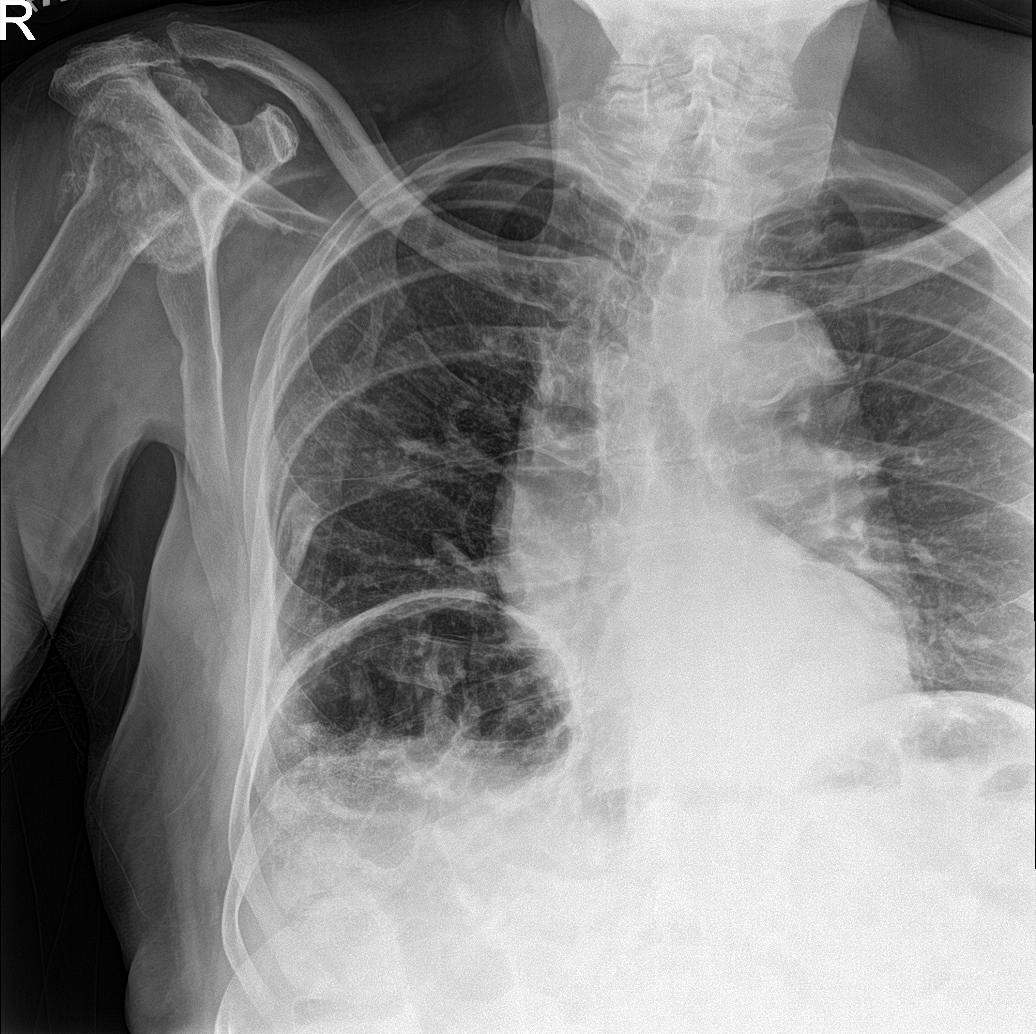

[rib pa obl (1 of 2)]
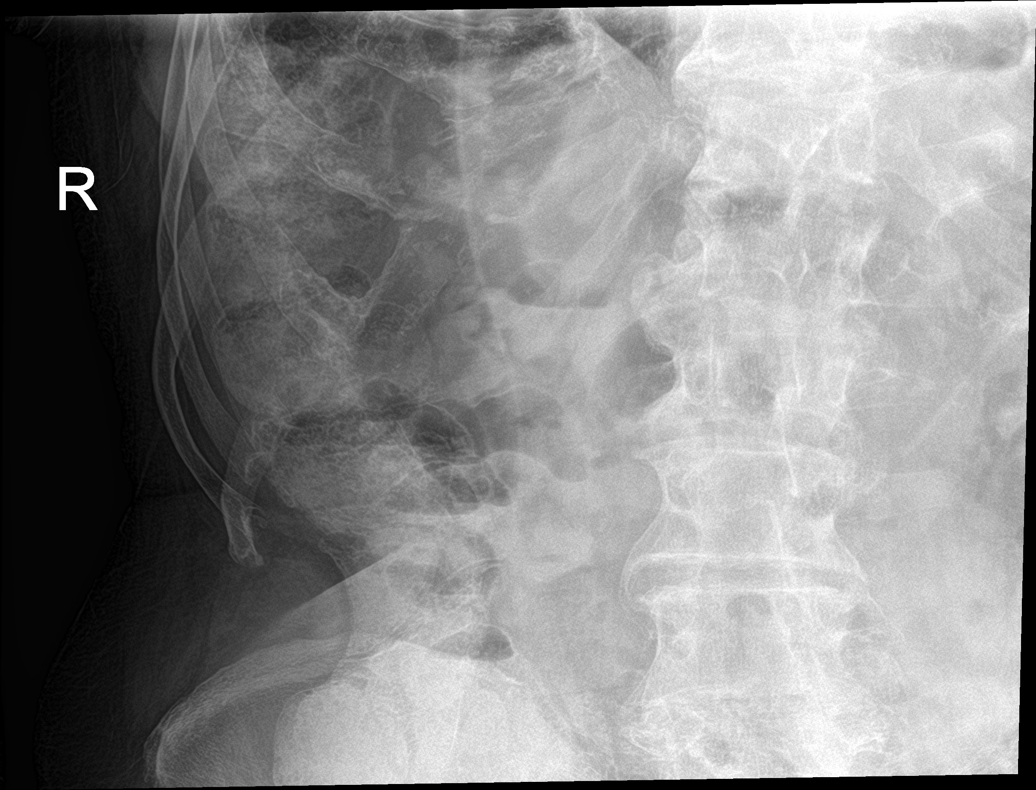

[rib pa obl (2 of 2)]
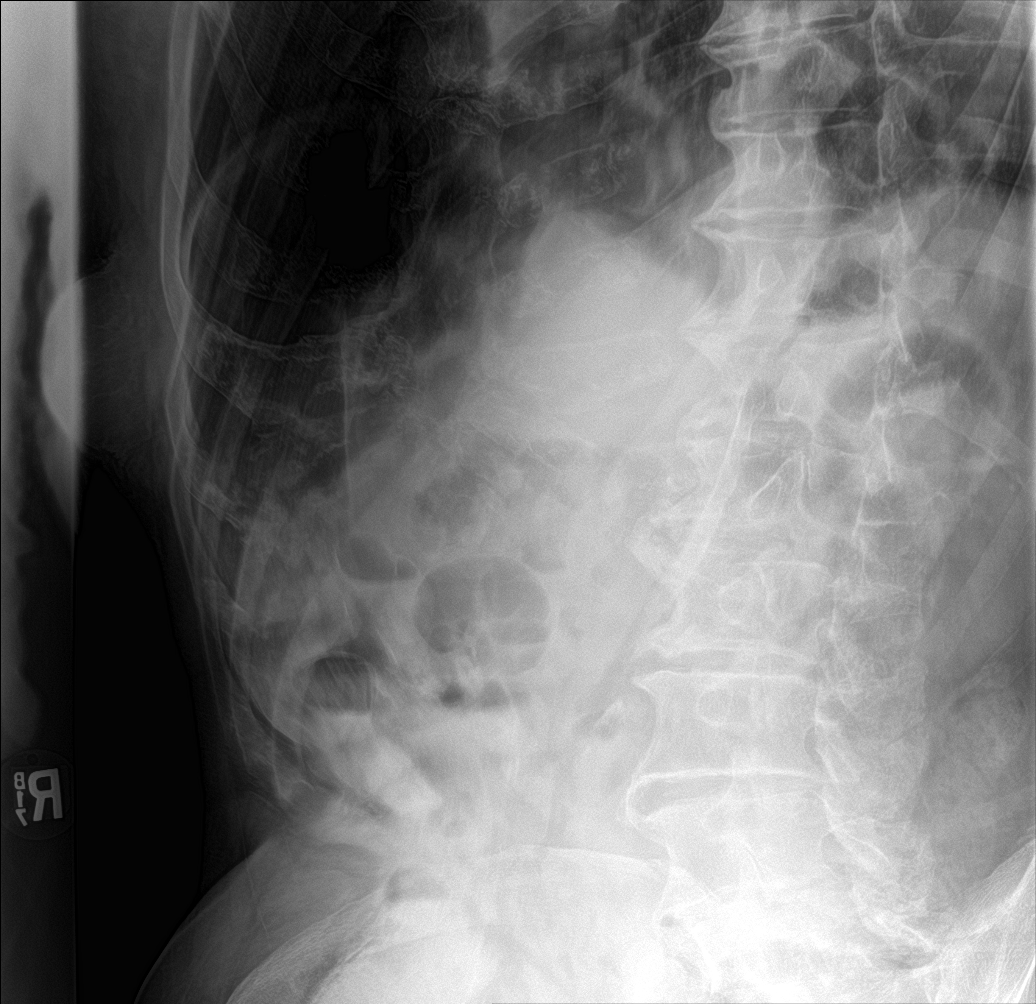

[rib pa (2 of 2)]
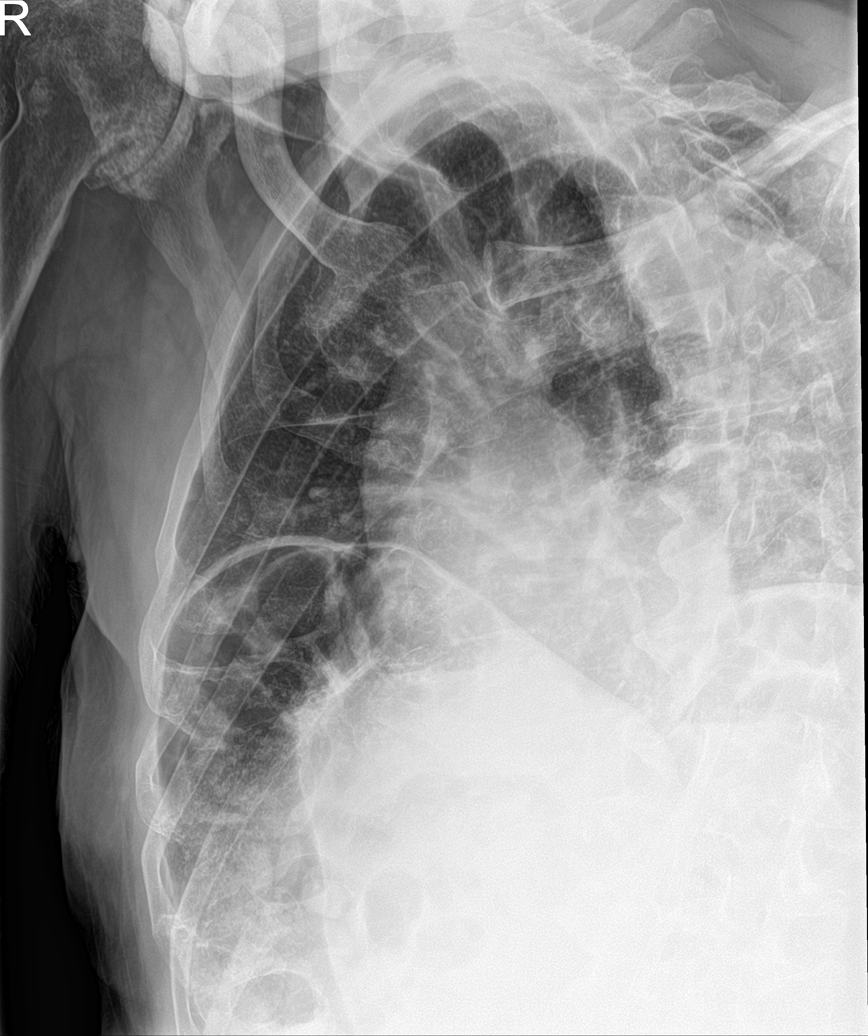

[4 of 4 positions shown; findings below may reference images not displayed]

FINDINGS: No acute fracture or definite RIGHT rib abnormality/lesion noted.

Degenerative changes within the shoulder again noted.
IMPRESSION: No RIGHT rib abnormality identified.

## 2020-05-20 ENCOUNTER — Other Ambulatory Visit: Payer: Self-pay

## 2020-05-20 ENCOUNTER — Other Ambulatory Visit: Payer: Medicare Other

## 2020-05-20 DIAGNOSIS — R972 Elevated prostate specific antigen [PSA]: Secondary | ICD-10-CM

## 2020-05-21 LAB — PSA: Prostate Specific Ag, Serum: 22.1 ng/mL — ABNORMAL HIGH (ref 0.0–4.0)

## 2020-05-26 ENCOUNTER — Encounter: Payer: Self-pay | Admitting: Urology

## 2020-05-26 ENCOUNTER — Other Ambulatory Visit: Payer: Self-pay

## 2020-05-26 ENCOUNTER — Ambulatory Visit (INDEPENDENT_AMBULATORY_CARE_PROVIDER_SITE_OTHER): Payer: Medicare Other | Admitting: Urology

## 2020-05-26 VITALS — BP 111/68 | HR 80 | Temp 98.9°F | Ht 73.0 in | Wt 184.0 lb

## 2020-05-26 DIAGNOSIS — R972 Elevated prostate specific antigen [PSA]: Secondary | ICD-10-CM

## 2020-05-26 DIAGNOSIS — N403 Nodular prostate with lower urinary tract symptoms: Secondary | ICD-10-CM | POA: Diagnosis not present

## 2020-05-26 DIAGNOSIS — C61 Malignant neoplasm of prostate: Secondary | ICD-10-CM

## 2020-05-26 DIAGNOSIS — R351 Nocturia: Secondary | ICD-10-CM | POA: Diagnosis not present

## 2020-05-26 LAB — MICROSCOPIC EXAMINATION
Bacteria, UA: NONE SEEN
Epithelial Cells (non renal): NONE SEEN /hpf (ref 0–10)
Renal Epithel, UA: NONE SEEN /hpf
WBC, UA: NONE SEEN /hpf (ref 0–5)

## 2020-05-26 LAB — URINALYSIS, ROUTINE W REFLEX MICROSCOPIC
Bilirubin, UA: NEGATIVE
Glucose, UA: NEGATIVE
Ketones, UA: NEGATIVE
Leukocytes,UA: NEGATIVE
Nitrite, UA: NEGATIVE
Protein,UA: NEGATIVE
Specific Gravity, UA: 1.015 (ref 1.005–1.030)
Urobilinogen, Ur: 0.2 mg/dL (ref 0.2–1.0)
pH, UA: 7 (ref 5.0–7.5)

## 2020-05-26 NOTE — Progress Notes (Signed)
Urological Symptom Review  Patient is experiencing the following symptoms: Frequent urination Burning/pain with urination Get up at night to urinate   Review of Systems  Gastrointestinal (upper)  : Negative for upper GI symptoms  Gastrointestinal (lower) : Negative for lower GI symptoms  Constitutional : Negative for symptoms  Skin: Negative for skin symptoms  Eyes: Negative for eye symptoms  Ear/Nose/Throat : Negative for Ear/Nose/Throat symptoms  Hematologic/Lymphatic: Negative for Hematologic/Lymphatic symptoms  Cardiovascular : Negative for cardiovascular symptoms  Respiratory : Negative for respiratory symptoms  Endocrine: Negative for endocrine symptoms  Musculoskeletal: Negative for musculoskeletal symptoms  Neurological: Negative for neurological symptoms  Psychologic: Negative for psychiatric symptoms  

## 2020-05-26 NOTE — Progress Notes (Signed)
Subjective:  1. Prostate cancer ([place])   2. Elevated PSA   3. Nocturia   4. Nodular prostate with lower urinary tract symptoms      I have prostate cancer. HPI: Devon Griffin is a 87 year-old male established patient who is here evaluation for treatment of prostate cancer.  His PSA at his time of diagnosis was 12.6. His most recent PSA is 20.89.   He has not undergone surgery for treatment. He has not undergone External Beam Radiation Therapy for treatment. He has not undergone Hormonal Therapy for treatment.   He does not have urinary incontinence. He has not recently had unwanted weight loss. He is not having pain in new locations.   Devon Griffin returns in f/u for his history of prostate cancer diagnosed by TURP and prostate biopsy on 04/03/15 which was done for retention and an elevated PSA for 12.6. He had Gleason 6 in 5% of the prostate chips and he had 4 cores positive with 3/4 having Gleason 7(3+4) in up to 60% of the cores on the prostate biopsy. His PSA had been rising slowly and is up to 22.1.  It was down to 18.8 in 7/21 but has been up to 20.89 previously.  He had a Bonescan and CT that showed no mets in 8/19, but he does have chronic left hydro. He is doing well with voiding but has nocturia 3-4x.  His IPSS is 21.  He remainson tamsulosin.  He has no hematuria.  He has no incontinence. He has had no bone pain but has had gradual weight loss since he has been at the nursing home.  He has no other associated signs or symptoms.   UA today has 0-2 RBC's.       ROS:  ROS:  A complete review of systems was performed.  All systems are negative except for pertinent findings as noted.   ROS  Allergies  Allergen Reactions  . Other     Outpatient Encounter Medications as of 05/26/2020  Medication Sig  . acetaminophen (TYLENOL) 325 MG tablet Take 650 mg by mouth every 6 (six) hours as needed (pain).  . calcium carbonate (OS-CAL) 1250 (500 Ca) MG chewable tablet Chew 1 tablet by  mouth daily.  . cholecalciferol (VITAMIN D3) 25 MCG (1000 UNIT) tablet Take 1,000 Units by mouth daily.  Marland Kitchen senna (SENOKOT) 8.6 MG tablet Take 1 tablet by mouth daily.  . tamsulosin (FLOMAX) 0.4 MG CAPS capsule Take 0.4 mg by mouth daily.  . [DISCONTINUED] docusate sodium (COLACE) 100 MG capsule Take 100 mg by mouth 2 (two) times daily.   (Patient not taking: Reported on 11/20/2019)  . [DISCONTINUED] docusate sodium (COLACE) 100 MG capsule Take 1 capsule (100 mg total) by mouth 2 (two) times daily. (Patient not taking: Reported on 11/20/2019)  . [DISCONTINUED] ferrous sulfate 325 (65 FE) MG tablet Take 325 mg by mouth daily.  (Patient not taking: Reported on 11/20/2019)  . [DISCONTINUED] furosemide (LASIX) 20 MG tablet Take 20 mg by mouth daily. (Patient not taking: No sig reported)  . [DISCONTINUED] lisinopril (PRINIVIL,ZESTRIL) 20 MG tablet Take 20 mg by mouth daily. (Patient not taking: Reported on 11/20/2019)  . [DISCONTINUED] Multiple Vitamin (MULTIVITAMIN WITH MINERALS) TABS tablet Take 1 tablet by mouth daily. (Patient not taking: No sig reported)  . [DISCONTINUED] polyethylene glycol (MIRALAX / GLYCOLAX) packet Take 17 g by mouth daily as needed (constipation). Mix in 8oz of fluids. (Patient not taking: Reported on 11/20/2019)  . [DISCONTINUED] sodium phosphate (FLEET)  enema Place 1 enema rectally once. follow package directions (Patient not taking: Reported on 11/20/2019)  . [DISCONTINUED] sodium polystyrene (KAYEXALATE) powder Take by mouth once. (Patient not taking: Reported on 11/20/2019)  . [DISCONTINUED] traMADol (ULTRAM) 50 MG tablet Take 1 tablet (50 mg total) by mouth every 6 (six) hours as needed. (Patient not taking: Reported on 11/20/2019)   No facility-administered encounter medications on file as of 05/26/2020.    Past Medical History:  Diagnosis Date  . Anemia   . Anemia 06/19/2011  . Arthritis   . Cancer [facility])    Prostate  . Elevated prostate specific antigen (PSA)   . Hypertension   .  Premature atrial contractions   . Renal insufficiency     Past Surgical History:  Procedure Laterality Date  . APPENDECTOMY    . HERNIA REPAIR     bilateral inguinal repair  . JOINT REPLACEMENT     right knee  . JOINT REPLACEMENT     left knee  . PROSTATE BIOPSY N/A 03/31/2015   Procedure: PROSTATE ULTRASOUND AND BIOPSY;  Surgeon: Irine Seal, MD;  Location: [facility];  Service: Urology;  Laterality: N/A;  . TOTAL KNEE ARTHROPLASTY  08/20/2011   Procedure: TOTAL KNEE ARTHROPLASTY;  Surgeon: Carole Civil, MD;  Location: [facility];  Service: Orthopedics;  Laterality: Left;  DePuy  . TRANSURETHRAL RESECTION OF PROSTATE N/A 03/31/2015   Procedure: TRANSURETHRAL RESECTION OF THE PROSTATE (TURP);  Surgeon: Irine Seal, MD;  Location: [facility];  Service: Urology;  Laterality: N/A;    Social History   Socioeconomic History  . Marital status: Married    Spouse name: Not on file  . Number of children: Not on file  . Years of education: Not on file  . Highest education level: Not on file  Occupational History  . Not on file  Tobacco Use  . Smoking status: Never Smoker  . Smokeless tobacco: Never Used  Substance and Sexual Activity  . Alcohol use: No  . Drug use: No  . Sexual activity: Not on file  Other Topics Concern  . Not on file  Social History Narrative  . Not on file   Social Determinants of Health   Financial Resource Strain: Not on file  Food Insecurity: Not on file  Transportation Needs: Not on file  Physical Activity: Not on file  Stress: Not on file  Social Connections: Not on file  Intimate Partner Violence: Not on file    Family History  Problem Relation Age of Onset  . Arthritis Other   . Diabetes Mother   . Cancer Father        Objective: Vitals:   05/26/20 1024  BP: 111/68  Pulse: 80  Temp: 98.9 F (37.2 C)     Physical Exam Vitals reviewed.  Constitutional:      Appearance: Normal appearance.  Chest:  Breasts:     Left: No  supraclavicular adenopathy.    Genitourinary:    Comments: AP without lesions. NST without mass. Prostate 2+ with 3mm right mid-apical nodule. SV non-palpable.  Lymphadenopathy:     Upper Body:     Left upper body: No supraclavicular adenopathy.  Neurological:     Mental Status: He is alert.        Studies/Results: No results found.    Assessment & Plan: Prostate cancer with a slowly rising PSA.   His PSA continues to rise slowly.  He remains minimally symptomatic.  PSA in 6 months.  Prostate nodule with LUTS.  He has a 1.5cm right mid/apical nodule with mod/severe LUTS and will continue tamsulosin.   No orders of the defined types were placed in this encounter.    Orders Placed This Encounter  Procedures  . Urinalysis, Routine w reflex microscopic  . PSA    Standing Status:   Future    Standing Expiration Date:   05/26/2021      Return in about 6 months (around 11/23/2020) for with PSA.   CC: Renata Caprice, DO      Irine Seal 05/26/2020

## 2020-05-27 ENCOUNTER — Ambulatory Visit: Payer: Medicare Other | Admitting: Urology

## 2020-11-17 ENCOUNTER — Other Ambulatory Visit: Payer: Self-pay

## 2020-11-17 ENCOUNTER — Other Ambulatory Visit: Payer: Medicare Other

## 2020-11-17 DIAGNOSIS — C61 Malignant neoplasm of prostate: Secondary | ICD-10-CM

## 2020-11-18 LAB — PSA: Prostate Specific Ag, Serum: 22.2 ng/mL — ABNORMAL HIGH (ref 0.0–4.0)

## 2020-11-24 ENCOUNTER — Ambulatory Visit (INDEPENDENT_AMBULATORY_CARE_PROVIDER_SITE_OTHER): Payer: Medicare Other | Admitting: Urology

## 2020-11-24 ENCOUNTER — Other Ambulatory Visit: Payer: Self-pay

## 2020-11-24 VITALS — BP 127/72 | HR 73

## 2020-11-24 DIAGNOSIS — C61 Malignant neoplasm of prostate: Secondary | ICD-10-CM | POA: Diagnosis not present

## 2020-11-24 DIAGNOSIS — R351 Nocturia: Secondary | ICD-10-CM | POA: Diagnosis not present

## 2020-11-24 DIAGNOSIS — R972 Elevated prostate specific antigen [PSA]: Secondary | ICD-10-CM

## 2020-11-24 DIAGNOSIS — N403 Nodular prostate with lower urinary tract symptoms: Secondary | ICD-10-CM | POA: Diagnosis not present

## 2020-11-24 LAB — URINALYSIS, ROUTINE W REFLEX MICROSCOPIC
Bilirubin, UA: NEGATIVE
Glucose, UA: NEGATIVE
Ketones, UA: NEGATIVE
Leukocytes,UA: NEGATIVE
Nitrite, UA: NEGATIVE
RBC, UA: NEGATIVE
Specific Gravity, UA: 1.015 (ref 1.005–1.030)
Urobilinogen, Ur: 0.2 mg/dL (ref 0.2–1.0)
pH, UA: 6 (ref 5.0–7.5)

## 2020-11-24 MED ORDER — TAMSULOSIN HCL 0.4 MG PO CAPS: 0.4000 mg | ORAL_CAPSULE | Freq: Every day | ORAL | 3 refills | Status: DC

## 2020-11-24 NOTE — Progress Notes (Signed)
Subjective:  1. Prostate cancer ([place])   2. Elevated PSA   3. Nodular prostate with lower urinary tract symptoms   4. Nocturia      I have prostate cancer. HPI: Devon Griffin is a 87 year-old male established patient who is here evaluation for treatment of prostate cancer.  His PSA at his time of diagnosis was 12.6. His most recent PSA is 22.2  He has not undergone surgery for treatment. He has not undergone External Beam Radiation Therapy for treatment. He has not undergone Hormonal Therapy for treatment.   He does not have urinary incontinence. He has not recently had unwanted weight loss. He is not having pain in new locations.   Devon Griffin returns in f/u for his history of prostate cancer diagnosed by TURP and prostate biopsy on 04/03/15 which was done for retention and an elevated PSA for 12.6. He had Gleason 6 in 5% of the prostate chips and he had 4 cores positive with 3/4 having Gleason 7(3+4) in up to 60% of the cores on the prostate biopsy. His PSA had been rising slowly and is up to 22.2 from 22.1 in 1/22.  It was down to 18.8 in 7/21 but has been up to 20.89 previously.  He had a Bonescan and CT that showed no mets in 8/19, but he does have chronic left hydro. He is doing well with voiding but has nocturia 3-4x.  He uses a bedside commode.  He remains on tamsulosin.  He has a clear urine today.  He has had no weight loss or bone pain.  His back itches a lot.  He has no other associated signs or symptoms.        ROS:  ROS:  A complete review of systems was performed.  All systems are negative except for pertinent findings as noted.   ROS  Allergies  Allergen Reactions   Other     Outpatient Encounter Medications as of 11/24/2020  Medication Sig   acetaminophen (TYLENOL) 325 MG tablet Take 650 mg by mouth every 6 (six) hours as needed (pain).   calcium carbonate (OS-CAL) 1250 (500 Ca) MG chewable tablet Chew 1 tablet by mouth daily.   cholecalciferol (VITAMIN D3) 25 MCG  (1000 UNIT) tablet Take 1,000 Units by mouth daily.   senna (SENOKOT) 8.6 MG tablet Take 1 tablet by mouth daily.   [DISCONTINUED] tamsulosin (FLOMAX) 0.4 MG CAPS capsule Take 0.4 mg by mouth daily.   tamsulosin (FLOMAX) 0.4 MG CAPS capsule Take 1 capsule (0.4 mg total) by mouth daily.   No facility-administered encounter medications on file as of 11/24/2020.    Past Medical History:  Diagnosis Date   Anemia    Anemia 06/19/2011   Arthritis    Cancer ([facility])    Prostate   Elevated prostate specific antigen (PSA)    Hypertension    Premature atrial contractions    Renal insufficiency     Past Surgical History:  Procedure Laterality Date   APPENDECTOMY     HERNIA REPAIR     bilateral inguinal repair   JOINT REPLACEMENT     right knee   JOINT REPLACEMENT     left knee   PROSTATE BIOPSY N/A 03/31/2015   Procedure: PROSTATE ULTRASOUND AND BIOPSY;  Surgeon: Irine Seal, MD;  Location: [facility];  Service: Urology;  Laterality: N/A;   TOTAL KNEE ARTHROPLASTY  08/20/2011   Procedure: TOTAL KNEE ARTHROPLASTY;  Surgeon: Carole Civil, MD;  Location: [facility];  Service: Orthopedics;  Laterality: Left;  DePuy   TRANSURETHRAL RESECTION OF PROSTATE N/A 03/31/2015   Procedure: TRANSURETHRAL RESECTION OF THE PROSTATE (TURP);  Surgeon: Irine Seal, MD;  Location: [facility];  Service: Urology;  Laterality: N/A;    Social History   Socioeconomic History   Marital status: Married    Spouse name: Not on file   Number of children: Not on file   Years of education: Not on file   Highest education level: Not on file  Occupational History   Not on file  Tobacco Use   Smoking status: Never   Smokeless tobacco: Never  Substance and Sexual Activity   Alcohol use: No   Drug use: No   Sexual activity: Not on file  Other Topics Concern   Not on file  Social History Narrative   Not on file   Social Determinants of Health   Financial Resource Strain: Not on file  Food Insecurity: Not on file   Transportation Needs: Not on file  Physical Activity: Not on file  Stress: Not on file  Social Connections: Not on file  Intimate Partner Violence: Not on file    Family History  Problem Relation Age of Onset   Arthritis Other    Diabetes Mother    Cancer Father        Objective: Vitals:   11/24/20 1034  BP: 127/72  Pulse: 73     Physical Exam Vitals reviewed.  Constitutional:      Appearance: Normal appearance.  Chest:  Breasts:    Left: No supraclavicular adenopathy.  Genitourinary:    Comments: AP without lesions. NST without mass. Prostate 2+ with 30mm right mid-apical nodule. SV non-palpable.  Lymphadenopathy:     Upper Body:     Left upper body: No supraclavicular adenopathy.  Neurological:     Mental Status: He is alert.       Studies/Results: No results found.    Assessment & Plan: Prostate cancer with a slowly rising PSA.   His PSA continues to rise slowly.  He remains minimally symptomatic.  PSA in 6 months.  Prostate nodule with LUTS.  He has a 1.5cm right mid/apical nodule with mod/severe LUTS and will continue tamsulosin.   Meds ordered this encounter  Medications   tamsulosin (FLOMAX) 0.4 MG CAPS capsule    Sig: Take 1 capsule (0.4 mg total) by mouth daily.    Dispense:  90 capsule    Refill:  3     Orders Placed This Encounter  Procedures   Urinalysis, Routine w reflex microscopic   PSA    Standing Status:   Future    Standing Expiration Date:   11/24/2021   PSA    Standing Status:   Future    Standing Expiration Date:   11/24/2021      Return in about 6 months (around 05/27/2021) for with PSA.   CC: Devon Caprice, DO      Irine Seal 11/24/2020

## 2020-11-24 NOTE — Progress Notes (Signed)

## 2021-05-25 ENCOUNTER — Other Ambulatory Visit: Payer: Medicare Other

## 2021-05-25 ENCOUNTER — Other Ambulatory Visit: Payer: Self-pay

## 2021-05-25 DIAGNOSIS — C61 Malignant neoplasm of prostate: Secondary | ICD-10-CM

## 2021-05-25 DIAGNOSIS — R972 Elevated prostate specific antigen [PSA]: Secondary | ICD-10-CM

## 2021-05-26 LAB — PSA: Prostate Specific Ag, Serum: 25.3 ng/mL — ABNORMAL HIGH (ref 0.0–4.0)

## 2021-06-01 ENCOUNTER — Other Ambulatory Visit: Payer: Self-pay

## 2021-06-01 ENCOUNTER — Ambulatory Visit (INDEPENDENT_AMBULATORY_CARE_PROVIDER_SITE_OTHER): Payer: Medicare Other | Admitting: Urology

## 2021-06-01 VITALS — BP 124/76 | HR 71

## 2021-06-01 DIAGNOSIS — R351 Nocturia: Secondary | ICD-10-CM

## 2021-06-01 DIAGNOSIS — C61 Malignant neoplasm of prostate: Secondary | ICD-10-CM | POA: Diagnosis not present

## 2021-06-01 DIAGNOSIS — N403 Nodular prostate with lower urinary tract symptoms: Secondary | ICD-10-CM | POA: Diagnosis not present

## 2021-06-01 DIAGNOSIS — R339 Retention of urine, unspecified: Secondary | ICD-10-CM | POA: Diagnosis not present

## 2021-06-01 DIAGNOSIS — N138 Other obstructive and reflux uropathy: Secondary | ICD-10-CM

## 2021-06-01 LAB — BLADDER SCAN AMB NON-IMAGING: Scan Result: 298

## 2021-06-01 MED ORDER — FINASTERIDE 5 MG PO TABS: 5.0000 mg | ORAL_TABLET | Freq: Every day | ORAL | 3 refills | Status: DC

## 2021-06-01 NOTE — Progress Notes (Signed)
Urological Symptom Review  PVR 298  Patient is experiencing the following symptoms: Frequent urination Get up at night to urinate Leakage of urine   Review of Systems  Gastrointestinal (upper)  : Negative for upper GI symptoms  Gastrointestinal (lower) : Negative for lower GI symptoms  Constitutional : Negative for symptoms  Skin: Negative for skin symptoms  Eyes: Negative for eye symptoms  Ear/Nose/Throat : Negative for Ear/Nose/Throat symptoms  Hematologic/Lymphatic: Negative for Hematologic/Lymphatic symptoms  Cardiovascular : Negative for cardiovascular symptoms  Respiratory : Negative for respiratory symptoms  Endocrine: Negative for endocrine symptoms  Musculoskeletal: Negative for musculoskeletal symptoms  Neurological: Negative for neurological symptoms  Psychologic: Negative for psychiatric symptoms

## 2021-06-01 NOTE — Progress Notes (Signed)
Subjective:  1. Prostate cancer ([place])   2. Nodular prostate with urinary obstruction   3. Nocturia   4. Incomplete bladder emptying      I have prostate cancer. HPI: Devon Griffin is a 88 year-old male established patient who is here evaluation for treatment of prostate cancer.  His PSA at his time of diagnosis was 12.6. His most recent PSA is 25.3 which is up from 22.2 in 7/22.  He has not undergone surgery for treatment. He has not undergone External Beam Radiation Therapy for treatment. He has not undergone Hormonal Therapy for treatment.   He does not have urinary incontinence. He has not recently had unwanted weight loss. He is not having pain in new locations.   Devon Griffin returns in f/u for his history of prostate cancer diagnosed by TURP and prostate biopsy on 04/03/15 which was done for retention and an elevated PSA for 12.6. He had Gleason 6 in 5% of the prostate chips and he had 4 cores positive with 3/4 having Gleason 7(3+4) in up to 60% of the cores on the prostate biopsy. His PSA had been rising slowly and is up to 22.2 from 22.1 in 1/22.  It was down to 18.8 in 7/21 but has been up to 20.89 previously.  He had a Bonescan and CT that showed no mets in 8/19, but he does have chronic left hydro. He is doing well with voiding but has nocturia 3-4x.  He uses a bedside commode.  He remains on tamsulosin.  He has a clear urine today.  He has had no weight loss or bone pain.  His back itches a lot.  He has no other associated signs or symptoms.     06/01/21: Devon Griffin is having no pain.  He has nocturia that is stable.  He had recent constipation.   His PSA is up a bit further to 25.3.  He has a good appetite.   He may have lost some weight.   He has persistent itching of his back.   He was unable to get a UA today.  He has some mild soreness in the left groin area.  HIs PVR is 211ml.     ROS:  ROS:  A complete review of systems was performed.  All systems are negative except for  pertinent findings as noted.   ROS  Allergies  Allergen Reactions   Other     Outpatient Encounter Medications as of 06/01/2021  Medication Sig   acetaminophen (TYLENOL) 325 MG tablet Take 650 mg by mouth every 6 (six) hours as needed (pain).   calcium carbonate (OS-CAL) 1250 (500 Ca) MG chewable tablet Chew 1 tablet by mouth daily.   cholecalciferol (VITAMIN D3) 25 MCG (1000 UNIT) tablet Take 1,000 Units by mouth daily.   diclofenac Sodium (VOLTAREN) 1 % GEL Apply 1 application topically 4 (four) times daily.   finasteride (PROSCAR) 5 MG tablet Take 1 tablet (5 mg total) by mouth daily.   senna (SENOKOT) 8.6 MG tablet Take 1 tablet by mouth daily.   tamsulosin (FLOMAX) 0.4 MG CAPS capsule Take 1 capsule (0.4 mg total) by mouth daily.   No facility-administered encounter medications on file as of 06/01/2021.    Past Medical History:  Diagnosis Date   Anemia    Anemia 06/19/2011   Arthritis    Cancer ([facility])    Prostate   Elevated prostate specific antigen (PSA)    Hypertension    Premature atrial contractions  Renal insufficiency     Past Surgical History:  Procedure Laterality Date   APPENDECTOMY     HERNIA REPAIR     bilateral inguinal repair   JOINT REPLACEMENT     right knee   JOINT REPLACEMENT     left knee   PROSTATE BIOPSY N/A 03/31/2015   Procedure: PROSTATE ULTRASOUND AND BIOPSY;  Surgeon: Irine Seal, MD;  Location: [facility];  Service: Urology;  Laterality: N/A;   TOTAL KNEE ARTHROPLASTY  08/20/2011   Procedure: TOTAL KNEE ARTHROPLASTY;  Surgeon: Carole Civil, MD;  Location: [facility];  Service: Orthopedics;  Laterality: Left;  DePuy   TRANSURETHRAL RESECTION OF PROSTATE N/A 03/31/2015   Procedure: TRANSURETHRAL RESECTION OF THE PROSTATE (TURP);  Surgeon: Irine Seal, MD;  Location: [facility];  Service: Urology;  Laterality: N/A;    Social History   Socioeconomic History   Marital status: Married    Spouse name: Not on file   Number of children: Not on file    Years of education: Not on file   Highest education level: Not on file  Occupational History   Not on file  Tobacco Use   Smoking status: Never   Smokeless tobacco: Never  Substance and Sexual Activity   Alcohol use: No   Drug use: No   Sexual activity: Not on file  Other Topics Concern   Not on file  Social History Narrative   Not on file   Social Determinants of Health   Financial Resource Strain: Not on file  Food Insecurity: Not on file  Transportation Needs: Not on file  Physical Activity: Not on file  Stress: Not on file  Social Connections: Not on file  Intimate Partner Violence: Not on file    Family History  Problem Relation Age of Onset   Arthritis Other    Diabetes Mother    Cancer Father        Objective: Vitals:   06/01/21 1013  BP: 124/76  Pulse: 71     Physical Exam Vitals reviewed.  Constitutional:      Appearance: Normal appearance.  Abdominal:     General: Abdomen is flat.     Palpations: Abdomen is soft. There is no mass.     Tenderness: There is abdominal tenderness (slight SP soreness).  Lymphadenopathy:     Cervical: No cervical adenopathy.     Upper Body:     Right upper body: No supraclavicular adenopathy.     Left upper body: No supraclavicular adenopathy.  Neurological:     Mental Status: He is alert.       Studies/Results: No results found. PVR is 244ml.    Assessment & Plan: Prostate cancer with a slowly rising PSA.   His PSA continues to rise slowly.  He remains minimally symptomatic.  PSA in 3  months since I will be starting finasteride for the LUTS.   Prostate nodule with LUTS.  He has a 1.5cm right mid/apical nodule with mod/severe LUTS and will continue tamsulosin and add finasteride since his PVR is elevated.  I will reassess at 3 months.  We may need to consider more formal ADT depending on his response. .   Meds ordered this encounter  Medications   finasteride (PROSCAR) 5 MG tablet    Sig: Take 1  tablet (5 mg total) by mouth daily.    Dispense:  90 tablet    Refill:  3     Orders Placed This Encounter  Procedures   Urinalysis,  Routine w reflex microscopic   BLADDER SCAN AMB NON-IMAGING      Return in about 3 months (around 08/30/2021) for with PSA for a PVR. Marland Kitchen   CC: Renata Caprice, DO      Irine Seal 06/01/2021 Patient ID: Devon Griffin, male   DOB: 10-13-1928, 88 y.o.   MRN: [id number]

## 2021-08-31 ENCOUNTER — Other Ambulatory Visit: Payer: Medicare Other

## 2021-08-31 DIAGNOSIS — C61 Malignant neoplasm of prostate: Secondary | ICD-10-CM

## 2021-08-31 DIAGNOSIS — R972 Elevated prostate specific antigen [PSA]: Secondary | ICD-10-CM

## 2021-09-01 LAB — PSA: Prostate Specific Ag, Serum: 7.7 ng/mL — ABNORMAL HIGH (ref 0.0–4.0)

## 2021-09-07 ENCOUNTER — Encounter: Payer: Self-pay | Admitting: Urology

## 2021-09-07 ENCOUNTER — Ambulatory Visit (INDEPENDENT_AMBULATORY_CARE_PROVIDER_SITE_OTHER): Payer: Medicare Other | Admitting: Urology

## 2021-09-07 VITALS — BP 136/65 | HR 84

## 2021-09-07 DIAGNOSIS — N403 Nodular prostate with lower urinary tract symptoms: Secondary | ICD-10-CM

## 2021-09-07 DIAGNOSIS — R351 Nocturia: Secondary | ICD-10-CM

## 2021-09-07 DIAGNOSIS — R972 Elevated prostate specific antigen [PSA]: Secondary | ICD-10-CM

## 2021-09-07 DIAGNOSIS — N138 Other obstructive and reflux uropathy: Secondary | ICD-10-CM

## 2021-09-07 DIAGNOSIS — R339 Retention of urine, unspecified: Secondary | ICD-10-CM | POA: Diagnosis not present

## 2021-09-07 DIAGNOSIS — C61 Malignant neoplasm of prostate: Secondary | ICD-10-CM

## 2021-09-07 LAB — MICROSCOPIC EXAMINATION
Bacteria, UA: NONE SEEN
Epithelial Cells (non renal): NONE SEEN /hpf (ref 0–10)
WBC, UA: NONE SEEN /hpf (ref 0–5)

## 2021-09-07 LAB — URINALYSIS, ROUTINE W REFLEX MICROSCOPIC
Bilirubin, UA: NEGATIVE
Glucose, UA: NEGATIVE
Ketones, UA: NEGATIVE
Leukocytes,UA: NEGATIVE
Nitrite, UA: NEGATIVE
Protein,UA: NEGATIVE
Specific Gravity, UA: 1.015 (ref 1.005–1.030)
Urobilinogen, Ur: 0.2 mg/dL (ref 0.2–1.0)
pH, UA: 5.5 (ref 5.0–7.5)

## 2021-09-07 MED ORDER — TAMSULOSIN HCL 0.4 MG PO CAPS: 0.4000 mg | ORAL_CAPSULE | Freq: Every day | ORAL | 3 refills | Status: DC

## 2021-09-07 NOTE — Progress Notes (Signed)
?Subjective: ? ?1. Prostate cancer ([place])   ?2. Elevated PSA   ?3. Nodular prostate with urinary obstruction   ?4. Incomplete bladder emptying   ?5. Nocturia   ?  ?09/07/21: Mr. Corkum returns today in f/u.   His PVR is down to 158m.   He remains on tamsulosin and had finasteride added at his visit in January.   His PSA has declined  to 7.7 from 25.3 prior to the finasteride.  He is voiding better but has a little incontinence.   He continues to have nocturia.  His UA is clear.  ? ?06/01/21: Mr. FLachmanis having no pain.  He has nocturia that is stable.  He had recent constipation.   His PSA is up a bit further to 25.3.  He has a good appetite.   He may have lost some weight.   He has persistent itching of his back.   He was unable to get a UA today.  He has some mild soreness in the left groin area.  HIs PVR is 2933m ? ?11/24/20: Mr. FuFidalgoeturns in f/u for his history of prostate cancer diagnosed by TURP and prostate biopsy on 04/03/15 which was done for retention and an elevated PSA for 12.6. He had Gleason 6 in 5% of the prostate chips and he had 4 cores positive with 3/4 having Gleason 7(3+4) in up to 60% of the cores on the prostate biopsy. His PSA had been rising slowly and is up to 22.2 from 22.1 in 1/22.  It was down to 18.8 in 7/21 but has been up to 20.89 previously.  He had a [place] and CT that showed no mets in 8/19, but he does have chronic left hydro. He is doing well with voiding but has nocturia 3-4x.  He uses a bedside commode.  He remains on tamsulosin.  He has a clear urine today.  He has had no weight loss or bone pain.  His back itches a lot.  He has no other associated signs or symptoms.    ? ? ? ? ?  ? ?ROS: ? ?ROS:  ?A complete review of systems was performed.  All systems are negative except for pertinent findings as noted.  ? ?Review of Systems  ?All other systems reviewed and are negative. ? ?Allergies  ?Allergen Reactions  ? Other   ? ? ?Outpatient Encounter Medications as of  09/07/2021  ?Medication Sig  ? acetaminophen (TYLENOL) 325 MG tablet Take 650 mg by mouth every 6 (six) hours as needed (pain).  ? calcium carbonate (OS-CAL) 1250 (500 Ca) MG chewable tablet Chew 1 tablet by mouth daily.  ? cholecalciferol (VITAMIN D3) 25 MCG (1000 UNIT) tablet Take 1,000 Units by mouth daily.  ? diclofenac Sodium (VOLTAREN) 1 % GEL Apply 1 application topically 4 (four) times daily.  ? finasteride (PROSCAR) 5 MG tablet Take 1 tablet (5 mg total) by mouth daily.  ? senna (SENOKOT) 8.6 MG tablet Take 1 tablet by mouth daily.  ? [DISCONTINUED] tamsulosin (FLOMAX) 0.4 MG CAPS capsule Take 1 capsule (0.4 mg total) by mouth daily.  ? tamsulosin (FLOMAX) 0.4 MG CAPS capsule Take 1 capsule (0.4 mg total) by mouth daily.  ? ?No facility-administered encounter medications on file as of 09/07/2021.  ? ? ?Past Medical History:  ?Diagnosis Date  ? Anemia   ? Anemia 06/19/2011  ? Arthritis   [facility]  ? [facility]  ? Elevated prostate specific antigen (PSA)   ? Hypertension   ?  Premature atrial contractions   ? Renal insufficiency   ? ? ?Past Surgical History:  ?Procedure Laterality Date  ? APPENDECTOMY    ? HERNIA REPAIR    ? bilateral inguinal repair  ? JOINT REPLACEMENT    ? right knee  ? JOINT REPLACEMENT    ? left knee  ? PROSTATE BIOPSY N/A 03/31/2015  ? Procedure: PROSTATE ULTRASOUND AND BIOPSY;  Surgeon: Irine Seal, MD;  Location: [facility];  Service: Urology;  Laterality: N/A;  ? TOTAL KNEE ARTHROPLASTY  08/20/2011  ? Procedure: TOTAL KNEE ARTHROPLASTY;  Surgeon: Carole Civil, MD;  Location: [facility];  Service: Orthopedics;  Laterality: Left;  [place]  ? TRANSURETHRAL RESECTION OF PROSTATE N/A 03/31/2015  ? Procedure: TRANSURETHRAL RESECTION OF THE PROSTATE (TURP);  Surgeon: Irine Seal, MD;  Location: [facility];  Service: Urology;  Laterality: N/A;  ? ? ?Social History  ? ?Socioeconomic History  ? Marital status: Married  ?  Spouse name: Not on file  ? Number of children: Not on file  ? Years of education:  Not on file  ? Highest education level: Not on file  ?Occupational History  ? Not on file  ?Tobacco Use  ? Smoking status: Never  ? Smokeless tobacco: Never  ?Substance and Sexual Activity  ? Alcohol use: No  ? Drug use: No  ? Sexual activity: Not on file  ?Other Topics Concern  ? Not on file  ?Social History Narrative  ? Not on file  ? ?Social Determinants of Health  ? ?Financial Resource Strain: Not on file  ?Food Insecurity: Not on file  ?Transportation Needs: Not on file  ?Physical Activity: Not on file  ?Stress: Not on file  ?Social Connections: Not on file  ?Intimate Partner Violence: Not on file  ? ? ?Family History  ?Problem Relation Age of Onset  ? Arthritis Other   ? Diabetes Mother   ? Cancer Father   ? ? ? ? ? ?Objective: ?Vitals:  ? 09/07/21 1056  ?BP: 136/65  ?Pulse: 84  ? ? ? ?Physical Exam ?Vitals reviewed.  ?Constitutional:   ?   Appearance: Normal appearance.  ?Abdominal:  ?   General: Abdomen is flat.  ?   Palpations: Abdomen is soft. There is no mass.  ?   Tenderness: There is abdominal tenderness (slight SP soreness).  ?Lymphadenopathy:  ?   Cervical: No cervical adenopathy.  ?   Upper Body:  ?   Right upper body: No supraclavicular adenopathy.  ?   Left upper body: No supraclavicular adenopathy.  ?Neurological:  ?   Mental Status: He is alert.  ? ? ? ?Results for orders placed or performed in visit on 09/07/21 (from the past 24 hour(s))  ?Urinalysis, Routine w reflex microscopic     Status: Abnormal  ? Collection Time: 09/07/21 11:07 AM  ?Result Value Ref Range  ? Specific Gravity, UA 1.015 1.005 - 1.030  ? pH, UA 5.5 5.0 - 7.5  ? Color, UA Yellow Yellow  ? Appearance Ur Clear Clear  ? Leukocytes,UA Negative Negative  ? Protein,UA Negative Negative/Trace  ? Glucose, UA Negative Negative  ? Ketones, UA Negative Negative  ? RBC, UA Trace (A) Negative  ? Bilirubin, UA Negative Negative  ? Urobilinogen, Ur 0.2 0.2 - 1.0 mg/dL  ? Nitrite, UA Negative Negative  ? Microscopic Examination See below:    ? Narrative  ? Performed at:  [facility] ?[address] [place], [place]  [id number] ?Lab Director: Dory Larsen  [place], Phone:  [phone]  ?Microscopic Examination     Status: None  ? Collection Time: 09/07/21 11:07 AM  ? Urine  ?Result Value Ref Range  ? WBC, UA None seen 0 - 5 /hpf  ? RBC 0-2 0 - 2 /hpf  ? Epithelial Cells (non renal) None seen 0 - 10 /hpf  ? Mucus, UA Present Not Estab.  ? Bacteria, UA None seen None seen/Few  ? Narrative  ? Performed at:  [facility] ?[address], [place], [place]  [id number] ?Lab Director: [place], Phone:  [phone]  ? ? ? ?Studies/Results: ?No results found. ?PVR is 234m.  ? ? ?Assessment & Plan: ?Prostate cancer with a slowly rising PSA.   His PSA continues to rise slowly.  He remains minimally symptomatic.  PSA in 3  months since I will be starting finasteride for the LUTS.  ? ?Prostate nodule with LUTS.  He has a 1.5cm right mid/apical nodule with mod/severe LUTS and will continue tamsulosin and add finasteride since his PVR is elevated.  I will reassess at 3 months.  We may need to consider more formal ADT depending on his response. .  ? ?Meds ordered this encounter  ?Medications  ? tamsulosin (FLOMAX) 0.4 MG CAPS capsule  ?  Sig: Take 1 capsule (0.4 mg total) by mouth daily.  ?  Dispense:  90 capsule  ?  Refill:  3  ?  ? ?Orders Placed This Encounter  ?Procedures  ? Microscopic Examination  ? Urinalysis, Routine w reflex microscopic  ? PSA  ?  Standing Status:   Future  ?  Standing Expiration Date:   09/08/2022  ? BLADDER SCAN AMB NON-IMAGING  ?  ? ? ?Return in about 6 months (around 03/09/2022) for with PSA and PVR. . ? ? ?CC: KRenata Caprice DO   ? ? ? ?JIrine Seal?09/07/2021 ?Patient ID: HEXZAVIER RUDERMAN male   DOB: [birth year] 88y.o.   MRN: [id number]?Patient ID: HGUADALUPE NICKLESS male   DOB: [birth year] 88y.o.   MRN: [id number]? ?

## 2021-09-07 NOTE — Progress Notes (Signed)
post void residual=152 ?

## 2022-03-01 ENCOUNTER — Other Ambulatory Visit: Payer: Medicare Other

## 2022-03-01 DIAGNOSIS — C61 Malignant neoplasm of prostate: Secondary | ICD-10-CM

## 2022-03-02 LAB — PSA: Prostate Specific Ag, Serum: 4.8 ng/mL — ABNORMAL HIGH (ref 0.0–4.0)

## 2022-03-08 ENCOUNTER — Ambulatory Visit: Payer: Medicare Other | Admitting: Urology

## 2022-03-29 ENCOUNTER — Ambulatory Visit: Payer: Medicare Other | Admitting: Urology

## 2022-04-13 DEATH — deceased
